# Patient Record
Sex: Male | Born: 1960 | Race: White | Hispanic: No | Marital: Married | Smoking: Former smoker
Health system: Southern US, Community
[De-identification: ages and names within clinical notes are randomized; demographics above are authoritative.]

## PROBLEM LIST (undated history)

## (undated) DIAGNOSIS — I2510 Atherosclerotic heart disease of native coronary artery without angina pectoris: Secondary | ICD-10-CM

## (undated) DIAGNOSIS — Z86718 Personal history of other venous thrombosis and embolism: Secondary | ICD-10-CM

## (undated) DIAGNOSIS — F419 Anxiety disorder, unspecified: Secondary | ICD-10-CM

## (undated) DIAGNOSIS — Z923 Personal history of irradiation: Secondary | ICD-10-CM

## (undated) DIAGNOSIS — I639 Cerebral infarction, unspecified: Secondary | ICD-10-CM

## (undated) DIAGNOSIS — I219 Acute myocardial infarction, unspecified: Secondary | ICD-10-CM

## (undated) DIAGNOSIS — E785 Hyperlipidemia, unspecified: Secondary | ICD-10-CM

## (undated) DIAGNOSIS — Z86711 Personal history of pulmonary embolism: Secondary | ICD-10-CM

## (undated) DIAGNOSIS — Z87828 Personal history of other (healed) physical injury and trauma: Secondary | ICD-10-CM

## (undated) DIAGNOSIS — G4730 Sleep apnea, unspecified: Secondary | ICD-10-CM

## (undated) DIAGNOSIS — E119 Type 2 diabetes mellitus without complications: Secondary | ICD-10-CM

## (undated) DIAGNOSIS — Z8572 Personal history of non-Hodgkin lymphomas: Secondary | ICD-10-CM

## (undated) DIAGNOSIS — I10 Essential (primary) hypertension: Secondary | ICD-10-CM

## (undated) DIAGNOSIS — Z8739 Personal history of other diseases of the musculoskeletal system and connective tissue: Secondary | ICD-10-CM

## (undated) DIAGNOSIS — Z9221 Personal history of antineoplastic chemotherapy: Secondary | ICD-10-CM

## (undated) DIAGNOSIS — I429 Cardiomyopathy, unspecified: Secondary | ICD-10-CM

## (undated) HISTORY — DX: Type 2 diabetes mellitus without complications: E11.9

## (undated) HISTORY — DX: Atherosclerotic heart disease of native coronary artery without angina pectoris: I25.10

## (undated) HISTORY — DX: Hyperlipidemia, unspecified: E78.5

## (undated) HISTORY — DX: Anxiety disorder, unspecified: F41.9

## (undated) HISTORY — DX: Essential (primary) hypertension: I10

## (undated) HISTORY — DX: Cerebral infarction, unspecified: I63.9

---

## 1987-10-07 DIAGNOSIS — Z87828 Personal history of other (healed) physical injury and trauma: Secondary | ICD-10-CM

## 1987-10-07 HISTORY — PX: EXPLORATORY LAPAROTOMY: SUR591

## 1987-10-07 HISTORY — DX: Personal history of other (healed) physical injury and trauma: Z87.828

## 1998-01-10 ENCOUNTER — Inpatient Hospital Stay (HOSPITAL_COMMUNITY): Admission: EM | Admit: 1998-01-10 | Discharge: 1998-01-11 | Payer: Self-pay | Admitting: Emergency Medicine

## 2001-09-24 ENCOUNTER — Ambulatory Visit (HOSPITAL_COMMUNITY): Admission: RE | Admit: 2001-09-24 | Discharge: 2001-09-24 | Payer: Self-pay | Admitting: Cardiology

## 2001-10-06 HISTORY — PX: CORONARY ARTERY BYPASS GRAFT: SHX141

## 2001-10-20 ENCOUNTER — Encounter: Payer: Self-pay | Admitting: Surgery

## 2001-10-22 ENCOUNTER — Encounter: Payer: Self-pay | Admitting: Surgery

## 2001-10-22 ENCOUNTER — Inpatient Hospital Stay (HOSPITAL_COMMUNITY): Admission: RE | Admit: 2001-10-22 | Discharge: 2001-10-27 | Payer: Self-pay | Admitting: Surgery

## 2001-10-23 ENCOUNTER — Encounter: Payer: Self-pay | Admitting: Surgery

## 2001-10-24 ENCOUNTER — Encounter: Payer: Self-pay | Admitting: Cardiothoracic Surgery

## 2001-10-25 ENCOUNTER — Encounter: Payer: Self-pay | Admitting: Cardiothoracic Surgery

## 2004-10-06 HISTORY — PX: ABDOMINAL AORTIC ANEURYSM REPAIR: SUR1152

## 2004-10-06 HISTORY — PX: LAPAROSCOPIC CHOLECYSTECTOMY: SUR755

## 2005-07-31 ENCOUNTER — Ambulatory Visit: Payer: Self-pay | Admitting: Cardiovascular Disease

## 2005-08-01 ENCOUNTER — Ambulatory Visit: Payer: Self-pay | Admitting: Internal Medicine

## 2005-08-01 ENCOUNTER — Inpatient Hospital Stay (HOSPITAL_COMMUNITY): Admission: EM | Admit: 2005-08-01 | Discharge: 2005-08-02 | Payer: Self-pay | Admitting: Emergency Medicine

## 2005-09-15 ENCOUNTER — Inpatient Hospital Stay (HOSPITAL_COMMUNITY): Admission: RE | Admit: 2005-09-15 | Discharge: 2005-09-19 | Payer: Self-pay | Admitting: Vascular Surgery

## 2009-11-06 DIAGNOSIS — I639 Cerebral infarction, unspecified: Secondary | ICD-10-CM

## 2009-11-06 HISTORY — DX: Cerebral infarction, unspecified: I63.9

## 2010-11-11 ENCOUNTER — Emergency Department (HOSPITAL_COMMUNITY)
Admission: EM | Admit: 2010-11-11 | Discharge: 2010-11-11 | Disposition: A | Discharge status: Home / Self Care | Payer: BC Managed Care – PPO | Attending: Emergency Medicine | Admitting: Emergency Medicine

## 2010-11-11 DIAGNOSIS — R630 Anorexia: Secondary | ICD-10-CM | POA: Insufficient documentation

## 2010-11-11 DIAGNOSIS — R5381 Other malaise: Secondary | ICD-10-CM | POA: Insufficient documentation

## 2010-11-11 DIAGNOSIS — E7800 Pure hypercholesterolemia, unspecified: Secondary | ICD-10-CM | POA: Insufficient documentation

## 2010-11-11 DIAGNOSIS — R42 Dizziness and giddiness: Secondary | ICD-10-CM | POA: Insufficient documentation

## 2010-11-11 DIAGNOSIS — R110 Nausea: Secondary | ICD-10-CM | POA: Insufficient documentation

## 2010-11-11 DIAGNOSIS — R5383 Other fatigue: Secondary | ICD-10-CM | POA: Insufficient documentation

## 2010-11-11 DIAGNOSIS — I10 Essential (primary) hypertension: Secondary | ICD-10-CM | POA: Insufficient documentation

## 2010-11-11 LAB — HEPATIC FUNCTION PANEL
ALT: 24 U/L (ref 0–53)
AST: 26 U/L (ref 0–37)
Albumin: 3.8 g/dL (ref 3.5–5.2)
Alkaline Phosphatase: 60 U/L (ref 39–117)
Total Bilirubin: 1.2 mg/dL (ref 0.3–1.2)
Total Protein: 6.4 g/dL (ref 6.0–8.3)

## 2010-11-11 LAB — POCT I-STAT 3, ART BLOOD GAS (G3+)
Bicarbonate: 25.2 mEq/L — ABNORMAL HIGH (ref 20.0–24.0)
O2 Saturation: 95 %
Patient temperature: 97.7
TCO2: 26 mmol/L (ref 0–100)
pCO2 arterial: 34.9 mmHg — ABNORMAL LOW (ref 35.0–45.0)
pO2, Arterial: 68 mmHg — ABNORMAL LOW (ref 80.0–100.0)

## 2010-11-11 LAB — CARBOXYHEMOGLOBIN
Carboxyhemoglobin: 1.8 % — ABNORMAL HIGH (ref 0.5–1.5)
Methemoglobin: 0.9 % (ref 0.0–1.5)
O2 Saturation: 96.7 %

## 2010-12-17 DIAGNOSIS — I6789 Other cerebrovascular disease: Secondary | ICD-10-CM

## 2011-12-08 ENCOUNTER — Encounter (INDEPENDENT_AMBULATORY_CARE_PROVIDER_SITE_OTHER): Payer: Self-pay | Admitting: Surgery

## 2011-12-09 ENCOUNTER — Ambulatory Visit (INDEPENDENT_AMBULATORY_CARE_PROVIDER_SITE_OTHER): Payer: BC Managed Care – PPO | Admitting: Surgery

## 2011-12-09 ENCOUNTER — Encounter (INDEPENDENT_AMBULATORY_CARE_PROVIDER_SITE_OTHER): Payer: Self-pay | Admitting: Surgery

## 2011-12-09 VITALS — BP 162/108 | HR 84 | Temp 97.8°F | Resp 20 | Ht 67.0 in | Wt 262.0 lb

## 2011-12-09 DIAGNOSIS — R229 Localized swelling, mass and lump, unspecified: Secondary | ICD-10-CM

## 2011-12-09 DIAGNOSIS — R2230 Localized swelling, mass and lump, unspecified upper limb: Secondary | ICD-10-CM | POA: Insufficient documentation
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
## 2011-12-10 ENCOUNTER — Encounter (HOSPITAL_COMMUNITY): Payer: Self-pay | Admitting: Pharmacy Technician

## 2011-12-11 NOTE — Pre-Procedure Instructions (Signed)
20 STONE SPIRITO  12/11/2011   Your procedure is scheduled on:  Tues, Mar 12 @ 1200 PM  Report to [facility] at 1000 AM.  Call this number if you have problems the morning of surgery: [phone]   Remember:   Do not eat food:After Midnight.  May have clear liquids: up to 4 Hours before arrival.(until 6:00 am)  Clear liquids include soda, tea, black coffee, apple or grape juice, broth.  Take these medicines the morning of surgery with A SIP OF WATER: Xanax and Amlodipine   Do not wear jewelry, make-up or nail polish.  Do not wear lotions, powders, or perfumes. You may wear deodorant.  Do not shave 48 hours prior to surgery.  Do not bring valuables to the hospital.  Contacts, dentures or bridgework may not be worn into surgery.  Leave suitcase in the car. After surgery it may be brought to your room.  For patients admitted to the hospital, checkout time is 11:00 AM the day of discharge.   Patients discharged the day of surgery will not be allowed to drive home.    Special Instructions: CHG Shower Use Special Wash: 1/2 bottle night before surgery and 1/2 bottle morning of surgery.   Please read over the following fact sheets that you were given: Pain Booklet, Coughing and Deep Breathing, MRSA Information and Surgical Site Infection Prevention

## 2011-12-12 ENCOUNTER — Encounter (HOSPITAL_COMMUNITY)
Admission: RE | Admit: 2011-12-12 | Discharge: 2011-12-12 | Disposition: A | Discharge status: Home / Self Care | Payer: BC Managed Care – PPO | Source: Ambulatory Visit | Attending: Surgery | Admitting: Surgery

## 2011-12-12 ENCOUNTER — Other Ambulatory Visit: Payer: Self-pay

## 2011-12-12 ENCOUNTER — Encounter (HOSPITAL_COMMUNITY)
Admission: RE | Admit: 2011-12-12 | Discharge: 2011-12-12 | Disposition: A | Discharge status: Home / Self Care | Payer: BC Managed Care – PPO | Source: Ambulatory Visit | Attending: Anesthesiology | Admitting: Anesthesiology

## 2011-12-12 ENCOUNTER — Encounter (HOSPITAL_COMMUNITY): Payer: Self-pay

## 2011-12-12 DIAGNOSIS — Z09 Encounter for follow-up examination after completed treatment for conditions other than malignant neoplasm: Secondary | ICD-10-CM | POA: Insufficient documentation

## 2011-12-12 LAB — BASIC METABOLIC PANEL
GFR calc non Af Amer: >90 mL/min (ref >90)
Glucose, Bld: 206 mg/dL — ABNORMAL HIGH (ref 70–99)
Potassium: 3.5 mEq/L (ref 3.5–5.1)
Sodium: 141 mEq/L (ref 135–145)

## 2011-12-12 LAB — SURGICAL PCR SCREEN
MRSA, PCR: NEGATIVE
Staphylococcus aureus: POSITIVE — AB

## 2011-12-12 LAB — CBC
Hemoglobin: 16.2 g/dL (ref 13.0–17.0)
MCH: 30.4 pg (ref 26.0–34.0)
RBC: 5.33 MIL/uL (ref 4.22–5.81)
WBC: 8.8 10*3/uL (ref 4.0–10.5)

## 2011-12-12 NOTE — Progress Notes (Addendum)
Pt doesn't have a cardiologist;last saw Dr.Katz several years ago  Stress test done > 17yrs ago Echo done at [facility] in 2012-to request records  Heart cath done around 06/07  Several EKG's in [facility]

## 2011-12-12 NOTE — Progress Notes (Addendum)
Sees a neurologist Dr.Myers in [place] with [facility] is primary care-to request last office visit

## 2011-12-15 ENCOUNTER — Encounter (HOSPITAL_COMMUNITY): Payer: Self-pay | Admitting: Vascular Surgery

## 2011-12-15 ENCOUNTER — Encounter (INDEPENDENT_AMBULATORY_CARE_PROVIDER_SITE_OTHER): Payer: Self-pay | Admitting: General Surgery

## 2011-12-15 MED ORDER — CEFAZOLIN SODIUM-DEXTROSE 2-3 GM-% IV SOLR: 2.0000 g | INTRAVENOUS | Status: DC

## 2011-12-15 NOTE — Consult Note (Signed)
Anesthesia:  Patient is a 51 year old male scheduled for an excision of a right axillary mass (5.7 X 5.4 X 3.5 cm) by Dr. Corliss Skains on 12/16/11.  By Dr. Fatima Sanger H&P, the mass most likely represents a possible enlarged LN with central necrosis by U/S or a lipoma.  History includes smoking, CAD/CABG '03, MI '99, HLD, CVA 11/2010, HTN, anxiety, obesity (BMI 41), history of blood transfusion, s/p aorto-iliac bypass for AAA in 09/2005.  His non-fasting glucose is 206 although he currently does not report a history of DM.  Meds include Xanax, amlodipine, ASA, Plavix, HCTZ, and losartan.  CXR shows no active disease.  EKG shows NSR, possible LAE, septal infarct age undetermined, lateral T wave abnormality (appears new in I, aVL since 11/12/10 but not as apparent in V4, V5).  His pre-op  EKG on 09/16/05 also showed T wave inversion in I and aVL.  I called an spoke with William Calderon.  His PCP is Prudy Feeler, PA-C at [facility] in [place].  He was encouraged to follow-up with her regarding his hyperglycemia.  He confirms that he has not seen a Cardiologist is several years and has not had a stress test in > 5 years.  He had either a TIA or small CVA with no residual in March 2012 and had an echo done then, but did not see a Cardiologist.   Echo on 12/17/10 showed EF 69%, LV chamber size normal, LV wall motion and contractility are WNL, no significant valvular abnormalities. (In the echo conclusion it states, "There is a definite focal wall motion abnormalities", but I do not see this mentioned in the body of the report.  Unsure if this is actual or a transcription error.)  He denies CP, SOB, edema, polydipsia and polyuria.  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  He is able to walk up a flight of stairs, do yard work without difficulty.  I reviewed above with Anesthesiologist Dr. Ivin Booty.  Anesthesia recommends Cardiology evaluate pre-operatively due to his strong CAD history,  no recent Cardiology follow-up, questionable EKG changes, and unclear echo results.  I have notified Triage nurse Britta Mccreedy at [facility] that patient needs Cardiac clearance prior to proceeding.  I verified that their office will make Dr. Corliss Skains,  OR scheduling, and the patient aware as well.

## 2011-12-16 ENCOUNTER — Encounter (HOSPITAL_COMMUNITY): Admission: RE | Payer: Self-pay | Source: Ambulatory Visit

## 2011-12-16 ENCOUNTER — Ambulatory Visit (HOSPITAL_COMMUNITY): Admission: RE | Admit: 2011-12-16 | Payer: BC Managed Care – PPO | Source: Ambulatory Visit | Admitting: Surgery

## 2011-12-16 SURGERY — EXCISION MASS
Anesthesia: General | Site: Axilla | Laterality: Right

## 2012-01-14 ENCOUNTER — Ambulatory Visit (INDEPENDENT_AMBULATORY_CARE_PROVIDER_SITE_OTHER): Payer: BC Managed Care – PPO | Admitting: Cardiology

## 2012-01-14 ENCOUNTER — Encounter: Payer: Self-pay | Admitting: Cardiology

## 2012-01-14 VITALS — BP 152/80 | HR 79 | Resp 18 | Ht 67.0 in | Wt 263.0 lb

## 2012-01-14 DIAGNOSIS — E785 Hyperlipidemia, unspecified: Secondary | ICD-10-CM

## 2012-01-14 DIAGNOSIS — F17200 Nicotine dependence, unspecified, uncomplicated: Secondary | ICD-10-CM

## 2012-01-14 DIAGNOSIS — Z720 Tobacco use: Secondary | ICD-10-CM | POA: Insufficient documentation

## 2012-01-14 DIAGNOSIS — Z01810 Encounter for preprocedural cardiovascular examination: Secondary | ICD-10-CM

## 2012-01-14 DIAGNOSIS — I10 Essential (primary) hypertension: Secondary | ICD-10-CM

## 2012-01-14 DIAGNOSIS — I2510 Atherosclerotic heart disease of native coronary artery without angina pectoris: Secondary | ICD-10-CM | POA: Insufficient documentation
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
## 2012-01-14 NOTE — Assessment & Plan Note (Signed)
Patient is scheduled for general anesthesia. His coronary artery bypass and graft was 10 years ago. Plan Myoview for risk stratification preoperatively. If normal or low risk he may proceed with surgery.

## 2012-01-14 NOTE — Assessment & Plan Note (Signed)
Continue aspirin. He is off of Plavix for 2 months by his report. He apparently has been intolerant to statins in the past. He will consider trying Pravachol and contact us.

## 2012-01-14 NOTE — Assessment & Plan Note (Signed)
Patient counseled on discontinuing. 

## 2012-01-14 NOTE — Patient Instructions (Addendum)
Your physician wants you to follow-up in: 12 months with Dr Jens Som in [facility] office You will receive a reminder letter in the mail two months in advance. If you don't receive a letter, please call our office to schedule the follow-up appointment.   Your physician has requested that you have a lexiscan myoview. For further information please visit [email or web address] Please follow instruction sheet, as given.

## 2012-01-14 NOTE — Assessment & Plan Note (Signed)
Patient apparently had myalgias with Lipitor. He also thinks other statins may have caused problems. We discussed attempting Pravachol. He will consider this and contact us if agreeable. We discussed the benefits of statins in patients with coronary disease.

## 2012-01-14 NOTE — Assessment & Plan Note (Signed)
Blood pressure mildly elevated. He will continue his present medications and we will increase as needed.

## 2012-01-15 ENCOUNTER — Ambulatory Visit (HOSPITAL_COMMUNITY): Payer: BC Managed Care – PPO | Attending: Cardiology | Admitting: Radiology

## 2012-01-15 VITALS — BP 153/83 | Ht 67.0 in | Wt 260.0 lb

## 2012-01-15 DIAGNOSIS — Z01810 Encounter for preprocedural cardiovascular examination: Secondary | ICD-10-CM | POA: Insufficient documentation

## 2012-01-15 DIAGNOSIS — E669 Obesity, unspecified: Secondary | ICD-10-CM | POA: Insufficient documentation

## 2012-01-15 DIAGNOSIS — F17200 Nicotine dependence, unspecified, uncomplicated: Secondary | ICD-10-CM | POA: Insufficient documentation

## 2012-01-15 DIAGNOSIS — I10 Essential (primary) hypertension: Secondary | ICD-10-CM

## 2012-01-15 DIAGNOSIS — Z8679 Personal history of other diseases of the circulatory system: Secondary | ICD-10-CM | POA: Insufficient documentation

## 2012-01-15 DIAGNOSIS — Z951 Presence of aortocoronary bypass graft: Secondary | ICD-10-CM | POA: Insufficient documentation

## 2012-01-15 DIAGNOSIS — E785 Hyperlipidemia, unspecified: Secondary | ICD-10-CM | POA: Insufficient documentation

## 2012-01-15 DIAGNOSIS — Z720 Tobacco use: Secondary | ICD-10-CM

## 2012-01-15 DIAGNOSIS — I2510 Atherosclerotic heart disease of native coronary artery without angina pectoris: Secondary | ICD-10-CM

## 2012-01-15 MED ORDER — TECHNETIUM TC 99M TETROFOSMIN IV KIT
33.0000 | PACK | Freq: Once | INTRAVENOUS | Status: AC | PRN
  Administered 2012-01-15: 33 via INTRAVENOUS

## 2012-01-15 MED ORDER — TECHNETIUM TC 99M TETROFOSMIN IV KIT
11.0000 | PACK | Freq: Once | INTRAVENOUS | Status: AC | PRN
  Administered 2012-01-15: 11 via INTRAVENOUS

## 2012-01-15 MED ORDER — REGADENOSON 0.4 MG/5ML IV SOLN
0.4000 mg | Freq: Once | INTRAVENOUS | Status: AC
  Administered 2012-01-15: 0.4 mg via INTRAVENOUS

## 2012-01-15 NOTE — Progress Notes (Signed)
[facility] SITE 3 NUCLEAR MED [address] [place] [address] [phone]  Cardiology Nuclear Med Study  William Calderon is a 51 y.o. male     MRN : [id number]     DOB: 03/26/1961  Procedure Date: 01/15/2012  Nuclear Med Background Indication for Stress Test:  Evaluation for Ischemia and Clearance for Pending Surgical Removal of Axillary Mass by Dr. Manus Rudd History:  3/99 AWMI; 5/99 ION:GEXBMW; 12/02 UXL:KGMWNU ischemia, EF=35%>1/03 CABG; ' 06 Echo:Normal; '06 AAA Repair Cardiac Risk Factors: CVA, Hypertension, Lipids, Obesity and Smoker  Symptoms:  No cardiac complaints   Nuclear Pre-Procedure Caffeine/Decaff Intake:  None NPO After: 630 pm   Lungs:  clear O2 Sat: 95% on room air. IV 0.9% NS with Angio Cath:  20g  IV Site: R Forearm  IV Started by:  Bonnita Levan, RN  Chest Size (in):  50 Cup Size: n/a  Height: 5\' 7"  (1.702 m)  Weight:  260 lb (117.935 kg)  BMI:  Body mass index is 40.72 kg/(m^2). Tech Comments:  N/A    Nuclear Med Study 1 or 2 day study: 1 day  Stress Test Type:  Lexiscan  Reading MD: Marca Ancona, MD  Order Authorizing Provider:  Olga Millers, MD  Resting Radionuclide: Technetium 41m Tetrofosmin  Resting Radionuclide Dose: 11.0 mCi   Stress Radionuclide:  Technetium 31m Tetrofosmin  Stress Radionuclide Dose: 32.0 mCi           Stress Protocol Rest HR: 65 Stress HR: 100  Rest BP: 153/83 Stress BP: 140/86  Exercise Time (min): 2:00 METS: n/a   Predicted Max HR: 169 bpm % Max HR: 59.17 bpm Rate Pressure Product: 27253   Dose of Adenosine (mg):  n/a Dose of Lexiscan: 0.4 mg  Dose of Atropine (mg): n/a Dose of Dobutamine: n/a mcg/kg/min (at max HR)  Stress Test Technologist: Smiley Houseman, CMA-N  Nuclear Technologist:  Domenic Polite, CNMT     Rest Procedure:  Myocardial perfusion imaging was performed at rest 45 minutes following the intravenous administration of Technetium 59m Tetrofosmin.  Rest ECG: 3-beat  v-tach, prior SWMI and nonspecific T-wave changes.  Stress Procedure:  The patient received IV Lexiscan 0.4 mg over 15-seconds with concurrent low level exercise and then Technetium 62m Tetrofosmin was injected at 30-seconds while the patient continued walking one more minute. There were no significant changes with Lexiscan; frequent PVC's with occasional couplets noted. Quantitative spect images were obtained after a 45-minute delay.  Stress ECG: PVCs  QPS Raw Data Images:  Normal; no motion artifact; normal heart/lung ratio. Stress Images:  Large, severe mid to apical anterior and anteroseptal perfusion defect.  Rest Images:  Large, severe mid to apical anterior and anteroseptal perfusion defect.  Subtraction (SDS):  Fixed large mid to apical anterior and anteroseptal perfusion defect.  Transient Ischemic Dilatation (Normal <1.22):  1.10 Lung/Heart Ratio (Normal <0.45):  0.43  Quantitative Gated Spect Images QGS EDV:  207 ml QGS ESV:  122 ml  Impression Exercise Capacity:  Lexiscan with low level exercise. BP Response:  Normal blood pressure response. Clinical Symptoms:  Short of breath.  ECG Impression:  PVCs Comparison with Prior Nuclear Study: Inferolateral defect on prior study is no longer present.   Overall Impression:  Abnormal stress nuclear study.  Large, severe fixed mid to apical anterior and anteroseptal perfusion defect suggesting prior MI with minimal ischemia.   LV Ejection Fraction: 41%.  LV Wall Motion:  Peri-apical hypokinesis.   Mylez Venable [employer]

## 2012-01-16 ENCOUNTER — Telehealth (INDEPENDENT_AMBULATORY_CARE_PROVIDER_SITE_OTHER): Payer: Self-pay | Admitting: General Surgery

## 2012-01-16 ENCOUNTER — Other Ambulatory Visit (INDEPENDENT_AMBULATORY_CARE_PROVIDER_SITE_OTHER): Payer: Self-pay | Admitting: Surgery

## 2012-01-16 ENCOUNTER — Telehealth: Payer: Self-pay | Admitting: Cardiology

## 2012-01-16 ENCOUNTER — Telehealth (INDEPENDENT_AMBULATORY_CARE_PROVIDER_SITE_OTHER): Payer: Self-pay

## 2012-01-16 DIAGNOSIS — I10 Essential (primary) hypertension: Secondary | ICD-10-CM

## 2012-01-16 MED ORDER — METOPROLOL SUCCINATE ER 25 MG PO TB24: 25.0000 mg | ORAL_TABLET | Freq: Every day | ORAL | Status: DC

## 2012-01-16 NOTE — Telephone Encounter (Signed)
Stress test done yesterday, requesting results

## 2012-01-16 NOTE — Telephone Encounter (Signed)
Notified mr Alejos with results of stress test, called in metoprolol succ. 25mg  1 tab qd--#30 with11 refills--[facility]---called [facility] and faxed stress test and OK for surgery to them--pt aware of all the above information--nt

## 2012-01-16 NOTE — Telephone Encounter (Signed)
Called William Calderon and told him that we had his Cardiac Clearance and that our schedulers will give him a call

## 2012-01-16 NOTE — Telephone Encounter (Signed)
done

## 2012-01-21 ENCOUNTER — Encounter (HOSPITAL_COMMUNITY): Payer: Self-pay | Admitting: Pharmacy Technician

## 2012-01-22 ENCOUNTER — Encounter (HOSPITAL_COMMUNITY): Payer: Self-pay

## 2012-01-22 ENCOUNTER — Encounter (HOSPITAL_COMMUNITY)
Admission: RE | Admit: 2012-01-22 | Discharge: 2012-01-22 | Disposition: A | Discharge status: Home / Self Care | Payer: BC Managed Care – PPO | Source: Ambulatory Visit | Attending: Surgery | Admitting: Surgery

## 2012-01-22 HISTORY — DX: Sleep apnea, unspecified: G47.30

## 2012-01-22 LAB — BASIC METABOLIC PANEL
BUN: 10 mg/dL (ref 6–23)
CO2: 25 mEq/L (ref 19–32)
Calcium: 9.5 mg/dL (ref 8.4–10.5)
Creatinine, Ser: 0.73 mg/dL (ref 0.50–1.35)
Glucose, Bld: 145 mg/dL — ABNORMAL HIGH (ref 70–99)

## 2012-01-22 LAB — CBC
HCT: 44.9 % (ref 39.0–52.0)
Hemoglobin: 15.9 g/dL (ref 13.0–17.0)
MCH: 32 pg (ref 26.0–34.0)
MCV: 90.3 fL (ref 78.0–100.0)
RBC: 4.97 MIL/uL (ref 4.22–5.81)

## 2012-01-22 LAB — SURGICAL PCR SCREEN: Staphylococcus aureus: NEGATIVE

## 2012-01-22 NOTE — Patient Instructions (Addendum)
[name]  01/22/2012   Your procedure is scheduled on:  Tuesday 01-27-2012  Report to [facility] a [phone]   AM.  Call this number if you have problems the morning of surgery: [phone]   Remember:   Do not eat food or drink liquids:After Midnight.  .  Take these medicines the morning of surgery with A SIP OF WATER: XANAX    Do not wear jewelry or make up.  Do not wear lotions, powders, or perfumes.Do not wear deodorant.    Do not bring valuables to the hospital.  Contacts, dentures or bridgework may not be worn into surgery.  Leave suitcase in the car. After surgery it may be brought to your room.  For patients admitted to the hospital, checkout time is 11:00 AM the day of discharge.     Special Instructions: CHG Shower Use Special Wash: 1/2 bottle night before surgery and 1/2 bottle morning of surgery.neck down avoid private area   Please read over the following fact sheets that you were given: MRSA Information  William Calderon [facility] pre op nurse phone number [phone], call if needed

## 2012-01-22 NOTE — Progress Notes (Signed)
01/22/12 1458  OBSTRUCTIVE SLEEP APNEA  Have you ever been diagnosed with sleep apnea through a sleep study? No  Do you snore loudly (loud enough to be heard through closed doors)?  0  Do you often feel tired, fatigued, or sleepy during the daytime? 0  Has anyone observed you stop breathing during your sleep? 0  Do you have, or are you being treated for high blood pressure? 1  BMI more than 35 kg/m2? 1  Age over 52 years old? 1  Gender: 1  Obstructive Sleep Apnea Score 4   Score 4 or greater  Updated health history;Results sent to PCP

## 2012-01-22 NOTE — Pre-Procedure Instructions (Addendum)
STRESS TEST 01-16-12 IN EPIC AND ON CHART CHEST 2 VIEW XRAY 12-12-11 IN EPIC AND ON CHART 01-14-12 EKG IN EPIC AND ON CHART 01-14-12 LAST CARDIOLOGY OFFICE VISIT NOTE DR Jens Som IN EPIC AND ON CHART

## 2012-01-27 ENCOUNTER — Ambulatory Visit (HOSPITAL_COMMUNITY): Payer: BC Managed Care – PPO | Admitting: Anesthesiology

## 2012-01-27 ENCOUNTER — Ambulatory Visit (HOSPITAL_COMMUNITY)
Admission: RE | Admit: 2012-01-27 | Discharge: 2012-01-27 | Disposition: A | Discharge status: Home / Self Care | Payer: BC Managed Care – PPO | Source: Ambulatory Visit | Attending: Surgery | Admitting: Surgery

## 2012-01-27 ENCOUNTER — Encounter (HOSPITAL_COMMUNITY)
Admission: RE | Disposition: A | Discharge status: Home / Self Care | Payer: Self-pay | Source: Ambulatory Visit | Attending: Surgery

## 2012-01-27 ENCOUNTER — Encounter (HOSPITAL_COMMUNITY): Payer: Self-pay | Admitting: *Deleted

## 2012-01-27 ENCOUNTER — Encounter (HOSPITAL_COMMUNITY): Payer: Self-pay | Admitting: Anesthesiology

## 2012-01-27 DIAGNOSIS — Z79899 Other long term (current) drug therapy: Secondary | ICD-10-CM | POA: Insufficient documentation

## 2012-01-27 DIAGNOSIS — I252 Old myocardial infarction: Secondary | ICD-10-CM | POA: Insufficient documentation

## 2012-01-27 DIAGNOSIS — C8584 Other specified types of non-Hodgkin lymphoma, lymph nodes of axilla and upper limb: Secondary | ICD-10-CM

## 2012-01-27 DIAGNOSIS — I2510 Atherosclerotic heart disease of native coronary artery without angina pectoris: Secondary | ICD-10-CM | POA: Insufficient documentation

## 2012-01-27 DIAGNOSIS — I10 Essential (primary) hypertension: Secondary | ICD-10-CM | POA: Insufficient documentation

## 2012-01-27 DIAGNOSIS — Z01812 Encounter for preprocedural laboratory examination: Secondary | ICD-10-CM | POA: Insufficient documentation

## 2012-01-27 DIAGNOSIS — R2230 Localized swelling, mass and lump, unspecified upper limb: Secondary | ICD-10-CM

## 2012-01-27 DIAGNOSIS — C8294 Follicular lymphoma, unspecified, lymph nodes of axilla and upper limb: Secondary | ICD-10-CM | POA: Insufficient documentation

## 2012-01-27 DIAGNOSIS — Z951 Presence of aortocoronary bypass graft: Secondary | ICD-10-CM | POA: Insufficient documentation

## 2012-01-27 DIAGNOSIS — F17200 Nicotine dependence, unspecified, uncomplicated: Secondary | ICD-10-CM | POA: Insufficient documentation

## 2012-01-27 DIAGNOSIS — Z8673 Personal history of transient ischemic attack (TIA), and cerebral infarction without residual deficits: Secondary | ICD-10-CM | POA: Insufficient documentation

## 2012-01-27 DIAGNOSIS — E785 Hyperlipidemia, unspecified: Secondary | ICD-10-CM | POA: Insufficient documentation

## 2012-01-27 DIAGNOSIS — Z7982 Long term (current) use of aspirin: Secondary | ICD-10-CM | POA: Insufficient documentation

## 2012-01-27 HISTORY — PX: MASS EXCISION: SHX2000

## 2012-01-27 SURGERY — EXCISION MASS
Anesthesia: General | Site: Axilla | Laterality: Right | Wound class: Clean

## 2012-01-27 MED ORDER — OXYCODONE-ACETAMINOPHEN 5-325 MG PO TABS: 1.0000 | ORAL_TABLET | ORAL | Status: AC | PRN

## 2012-01-27 MED ORDER — HYDROMORPHONE HCL PF 1 MG/ML IJ SOLN: 0.2500 mg | INTRAMUSCULAR | Status: DC | PRN

## 2012-01-27 MED ORDER — ONDANSETRON HCL 4 MG/2ML IJ SOLN: 4.0000 mg | INTRAMUSCULAR | Status: DC | PRN

## 2012-01-27 MED ORDER — LACTATED RINGERS IV SOLN
INTRAVENOUS | Status: DC | PRN
  Administered 2012-01-27 (×2): via INTRAVENOUS

## 2012-01-27 MED ORDER — OXYCODONE-ACETAMINOPHEN 5-325 MG PO TABS: 1.0000 | ORAL_TABLET | ORAL | Status: DC | PRN

## 2012-01-27 MED ORDER — FENTANYL CITRATE 0.05 MG/ML IJ SOLN
INTRAMUSCULAR | Status: DC | PRN
  Administered 2012-01-27: 50 ug via INTRAVENOUS
  Administered 2012-01-27: 100 ug via INTRAVENOUS

## 2012-01-27 MED ORDER — MORPHINE SULFATE 10 MG/ML IJ SOLN: 2.0000 mg | INTRAMUSCULAR | Status: DC | PRN

## 2012-01-27 MED ORDER — EPHEDRINE SULFATE 50 MG/ML IJ SOLN
INTRAMUSCULAR | Status: DC | PRN
  Administered 2012-01-27: 7.5 mg via INTRAVENOUS

## 2012-01-27 MED ORDER — LACTATED RINGERS IV SOLN: INTRAVENOUS | Status: DC

## 2012-01-27 MED ORDER — MIDAZOLAM HCL 5 MG/5ML IJ SOLN
INTRAMUSCULAR | Status: DC | PRN
  Administered 2012-01-27: 2 mg via INTRAVENOUS

## 2012-01-27 MED ORDER — PROPOFOL 10 MG/ML IV EMUL
INTRAVENOUS | Status: DC | PRN
  Administered 2012-01-27: 200 mg via INTRAVENOUS

## 2012-01-27 MED ORDER — 0.9 % SODIUM CHLORIDE (POUR BTL) OPTIME
TOPICAL | Status: DC | PRN
  Administered 2012-01-27: 1000 mL

## 2012-01-27 MED ORDER — MEPERIDINE HCL 50 MG/ML IJ SOLN: 6.2500 mg | INTRAMUSCULAR | Status: DC | PRN

## 2012-01-27 MED ORDER — BUPIVACAINE-EPINEPHRINE PF 0.25-1:200000 % IJ SOLN
INTRAMUSCULAR | Status: DC | PRN
  Administered 2012-01-27: 9 mL

## 2012-01-27 MED ORDER — PROMETHAZINE HCL 25 MG/ML IJ SOLN: 6.2500 mg | INTRAMUSCULAR | Status: DC | PRN

## 2012-01-27 MED ORDER — BUPIVACAINE-EPINEPHRINE PF 0.25-1:200000 % IJ SOLN
INTRAMUSCULAR | Status: AC
  Filled 2012-01-27: qty 30

## 2012-01-27 SURGICAL SUPPLY — 47 items
ADH SKN CLS APL DERMABOND .7 (GAUZE/BANDAGES/DRESSINGS)
APL SKNCLS STERI-STRIP NONHPOA (GAUZE/BANDAGES/DRESSINGS) ×2
APPLIER CLIP 11 MED OPEN (CLIP) ×2
APR CLP MED 11 20 MLT OPN (CLIP) ×1
BENZOIN TINCTURE PRP APPL 2/3 (GAUZE/BANDAGES/DRESSINGS) ×4 IMPLANT
BLADE HEX COATED 2.75 (ELECTRODE) IMPLANT
BLADE SURG 15 STRL LF DISP TIS (BLADE) IMPLANT
BLADE SURG 15 STRL SS (BLADE)
CANISTER SUCTION 1200CC (MISCELLANEOUS) ×2 IMPLANT
CHLORAPREP W/TINT 26ML (MISCELLANEOUS) ×2 IMPLANT
CLIP APPLIE 11 MED OPEN (CLIP) ×1 IMPLANT
COVER SURGICAL LIGHT HANDLE (MISCELLANEOUS) ×1 IMPLANT
DECANTER SPIKE VIAL GLASS SM (MISCELLANEOUS) IMPLANT
DERMABOND ADVANCED (GAUZE/BANDAGES/DRESSINGS)
DERMABOND ADVANCED .7 DNX12 (GAUZE/BANDAGES/DRESSINGS) ×1 IMPLANT
DRAIN CHANNEL RND F F (WOUND CARE) ×1 IMPLANT
DRAPE UTILITY W/TAPE 26X15 (DRAPES) ×2 IMPLANT
DRAPE UTILITY XL STRL (DRAPES) ×2 IMPLANT
ELECT BLADE TIP CTD 4 INCH (ELECTRODE) ×1 IMPLANT
ELECT COATED BLADE 2.86 ST (ELECTRODE) IMPLANT
ELECT REM PT RETURN 9FT ADLT (ELECTROSURGICAL) ×2
ELECTRODE REM PT RTRN 9FT ADLT (ELECTROSURGICAL) ×1 IMPLANT
EVACUATOR DRAINAGE 10X20 100CC (DRAIN) IMPLANT
EVACUATOR SILICONE 100CC (DRAIN) ×2
GAUZE PACKING IODOFORM 1/4X5 (PACKING) IMPLANT
GLOVE BIO SURGEON STRL SZ7 (GLOVE) ×2 IMPLANT
GLOVE BIOGEL PI IND STRL 7.5 (GLOVE) ×1 IMPLANT
GLOVE BIOGEL PI INDICATOR 7.5 (GLOVE) ×1
GOWN STRL NON-REIN LRG LVL3 (GOWN DISPOSABLE) ×3 IMPLANT
GOWN STRL REIN XL XLG (GOWN DISPOSABLE) ×1 IMPLANT
NEEDLE HYPO 25X1 1.5 SAFETY (NEEDLE) ×2 IMPLANT
NS IRRIG 1000ML POUR BTL (IV SOLUTION) IMPLANT
PACK GENERAL/GYN (CUSTOM PROCEDURE TRAY) ×2 IMPLANT
SPONGE DRAIN TRACH 4X4 STRL 2S (GAUZE/BANDAGES/DRESSINGS) ×4 IMPLANT
SPONGE GAUZE 4X4 12PLY (GAUZE/BANDAGES/DRESSINGS) ×1 IMPLANT
STRIP CLOSURE SKIN 1/2X4 (GAUZE/BANDAGES/DRESSINGS) ×2 IMPLANT
SUT ETHILON 2 0 PS N (SUTURE) ×2 IMPLANT
SUT ETHILON 4 0 PS 2 18 (SUTURE) IMPLANT
SUT MNCRL AB 4-0 PS2 18 (SUTURE) ×2 IMPLANT
SUT SILK 2 0 SH (SUTURE) IMPLANT
SUT VIC AB 3-0 SH 27 (SUTURE) ×2
SUT VIC AB 3-0 SH 27XBRD (SUTURE) IMPLANT
SUT VIC AB 4-0 SH 18 (SUTURE) IMPLANT
SYR CONTROL 10ML LL (SYRINGE) ×2 IMPLANT
TAPE CLOTH SURG 4X10 WHT LF (GAUZE/BANDAGES/DRESSINGS) ×1 IMPLANT
TOWEL OR 17X26 10 PK STRL BLUE (TOWEL DISPOSABLE) ×2 IMPLANT
WATER STERILE IRR 1000ML POUR (IV SOLUTION) ×2 IMPLANT

## 2012-01-27 NOTE — Op Note (Signed)
Preop diagnosis: Right axillary mass (8 cm subcutaneous) Postop diagnosis: Same Procedure performed: Excision of right axillary mass Surgeon:  Nico Rogness K. Anesthesia: Gen.  Indications: this is a 51 year old male who presents with a three-month history of a palpable mass in his right axilla. This has become slightly larger and is causing some discomfort with movement. No history of trauma or infection in this area. An ultrasound showed hypoechoic oval circumscribed mass measuring 5.7 x 5.4 x 3.5 cm. The mass is notably larger at this time.  Description of procedure: The patient was brought to the operating room and placed in a supine position on the operating room table. After an adequate level of general anesthesia was obtained the patient's right chest and axilla were prepped with ChloraPrep and draped in sterile fashion. We infiltrated the skin and subcutaneous tissues over the mass with quarter percent Marcaine with epinephrine. I made a transverse incision. Dissection was carried down through the subcutaneous tissue to the surface of the mass. The mass is actually fairly firm. We excised the entire mass using blunt dissection as well as cautery dissection. We removed it in its entirety. There was a small bleeding vein which was ligated with hemoclips. The mass was sent for pathologic examination. We irrigated thoroughly and assured hemostasis. A 19 Blake drain was brought out through a stab incision and placed into the cavity. This was secured with a 2-0 nylon. 3-0 Vicryl was used to close subcutaneous tissues and 4 Monocryl was used to close the skin. Steri-Strips were applied. The drain was placed to bulb suction. All sponge, instrument, and needle counts are correct.  Wilmon Arms. Corliss Skains, MD, [facility]  01/27/2012 8:52 AM

## 2012-01-27 NOTE — Interval H&P Note (Signed)
History and Physical Interval Note:  01/27/2012 7:05 AM  William Calderon  has presented today for surgery, with the diagnosis of RIGHT AXILLARY MASS 5.7 CM  The various methods of treatment have been discussed with the patient and family. After consideration of risks, benefits and other options for treatment, the patient has consented to  Procedure(s) (LRB): EXCISION MASS (Right) as a surgical intervention .  The patients' history has been reviewed, patient examined, no change in status, stable for surgery.  I have reviewed the patients' chart and labs.  Questions were answered to the patient's satisfaction.     Lars Jeziorski K.

## 2012-01-27 NOTE — Anesthesia Preprocedure Evaluation (Addendum)
Anesthesia Evaluation  Patient identified by MRN, date of birth, ID band Patient awake    Reviewed: Allergy & Precautions, H&P , NPO status , Patient's Chart, lab work & pertinent test results  Airway Mallampati: III TM Distance: >3 FB Neck ROM: Full    Dental No notable dental hx.    Pulmonary neg pulmonary ROS, Current Smoker,  breath sounds clear to auscultation  Pulmonary exam normal       Cardiovascular hypertension, Pt. on medications + CAD (s/p cabg. neg stress test 4/13) and + Past MI negative cardio ROS  Rhythm:Regular Rate:Normal  S/p open AAA repair    Neuro/Psych CVA, No Residual Symptoms negative neurological ROS  negative psych ROS   GI/Hepatic negative GI ROS, Neg liver ROS, hiatal hernia,   Endo/Other  negative endocrine ROS  Renal/GU negative Renal ROS  negative genitourinary   Musculoskeletal negative musculoskeletal ROS (+)   Abdominal   Peds negative pediatric ROS (+)  Hematology negative hematology ROS (+)   Anesthesia Other Findings   Reproductive/Obstetrics negative OB ROS                          Anesthesia Physical Anesthesia Plan  ASA: III  Anesthesia Plan: General   Post-op Pain Management:    Induction: Intravenous  Airway Management Planned: LMA and Oral ETT  Additional Equipment:   Intra-op Plan:   Post-operative Plan: Extubation in OR  Informed Consent: I have reviewed the patients History and Physical, chart, labs and discussed the procedure including the risks, benefits and alternatives for the proposed anesthesia with the patient or authorized representative who has indicated his/her understanding and acceptance.   Dental advisory given  Plan Discussed with: CRNA  Anesthesia Plan Comments:        Anesthesia Quick Evaluation

## 2012-01-27 NOTE — Discharge Instructions (Signed)
[facility] A bulb drain is a small, plastic reservoir which creates a gentle suction. It is used to remove excess fluid from a surgical wound. The color and amount of fluid will vary. Immediately after surgery, the fluid is bright red. It may gradually change to a yellow color. When the amount decreases to about 1 or 2 tablespoons (15 to 30 cc) per 24 hours, your caregiver will usually remove it. DAILY CARE  Keep the bulb compressed at all times, except while emptying it. The compression creates suction.   Keep sites where the tubes enter the skin dry and covered with a light bandage (dressing).   Tape the tubes to your skin, 1 to 2 inches below the insertion sites, to keep from pulling on your stitches. Tubes are stitched in place and will not slip out.   Pin the bulb to your shirt (not to your pants) with a safety pin.   For the first few days after surgery, there usually is more fluid in the bulb. Empty the bulb whenever it becomes half full because the bulb does not create enough suction if it is too full. Include this amount in your 24 hour totals.   When the amount of drainage decreases, empty the bulb at the same time every day. Write down the amounts and the 24 hour totals. Your caregiver will want to know them. This helps your caregiver know when the tubes can be removed.   Once you are home, it is no longer necessary to strip the tubes as may have been done in the hospital. If there is drainage around the tube sites, change dressings and keep the area dry. If you see a clot in the tube, leave it alone. However, if the tube does not appear to be draining, let your caregiver know.  TO EMPTY THE BULB  Open the stopper to release suction.   Holding the stopper out of the way, pour drainage into the measuring cup that was sent home with you.   Measure and write down the amount. If there are 2 bulbs, note the amount of drainage from bulb 1 or bulb 2 and keep the totals separate.  Your caregiver will want to know which tube is draining more.   Compress the bulb by folding it in half.   Replace the stopper.   Check the tape that holds the tube to your skin, and pin the bulb to your shirt.  SEEK MEDICAL CARE IF:  The drainage develops a bad odor.   You have an oral temperature above 102 F (38.9 C).   The amount of drainage from your wound suddenly increases or decreases.   You accidentally pull out your drain.   You have any other questions or concerns.  MAKE SURE YOU:   Understand these instructions.   Will watch your condition.   Will get help right away if you are not doing well or get worse.  Document Released: 09/19/2000 Document Revised: 09/11/2011 Document Reviewed: 09/22/2005 Exit[employer] Patient Information 2012 [employer]. 

## 2012-01-27 NOTE — Anesthesia Postprocedure Evaluation (Signed)
  Anesthesia Post-op Note  Patient: William Calderon  Procedure(s) Performed: Procedure(s) (LRB): EXCISION MASS (Right)  Patient Location: PACU  Anesthesia Type: General  Level of Consciousness: awake and alert   Airway and Oxygen Therapy: Patient Spontanous Breathing  Post-op Pain: mild  Post-op Assessment: Post-op Vital signs reviewed, Patient's Cardiovascular Status Stable, Respiratory Function Stable, Patent Airway and No signs of Nausea or vomiting  Post-op Vital Signs: stable  Complications: No apparent anesthesia complications

## 2012-01-27 NOTE — Progress Notes (Signed)
Wife works at nursing home and feels very comfortable with [facility].
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
## 2012-01-27 NOTE — Progress Notes (Signed)
Dr. Willa Frater, anesthesiologist, aware of pt's SAO2 90-93 preop; same as preop

## 2012-01-27 NOTE — Transfer of Care (Signed)
Immediate Anesthesia Transfer of Care Note  Patient: William Calderon  Procedure(s) Performed: Procedure(s) (LRB): EXCISION MASS (Right)  Patient Location: PACU  Anesthesia Type: General  Level of Consciousness: awake, sedated and patient cooperative  Airway & Oxygen Therapy: Patient Spontanous Breathing and Patient connected to face mask oxygen  Post-op Assessment: Report given to PACU RN and Post -op Vital signs reviewed and stable  Post vital signs: Reviewed and stable  Complications: No apparent anesthesia complications

## 2012-01-28 ENCOUNTER — Encounter (INDEPENDENT_AMBULATORY_CARE_PROVIDER_SITE_OTHER): Payer: Self-pay | Admitting: General Surgery

## 2012-01-30 ENCOUNTER — Other Ambulatory Visit (INDEPENDENT_AMBULATORY_CARE_PROVIDER_SITE_OTHER): Payer: Self-pay | Admitting: Surgery

## 2012-01-30 DIAGNOSIS — C8590 Non-Hodgkin lymphoma, unspecified, unspecified site: Secondary | ICD-10-CM

## 2012-02-02 ENCOUNTER — Telehealth: Payer: Self-pay | Admitting: Oncology

## 2012-02-02 ENCOUNTER — Ambulatory Visit (INDEPENDENT_AMBULATORY_CARE_PROVIDER_SITE_OTHER): Payer: BC Managed Care – PPO | Admitting: Surgery

## 2012-02-02 ENCOUNTER — Encounter (HOSPITAL_COMMUNITY): Payer: Self-pay | Admitting: Surgery

## 2012-02-02 VITALS — BP 162/100 | HR 78 | Temp 98.6°F | Resp 18 | Ht 67.0 in | Wt 263.0 lb

## 2012-02-02 DIAGNOSIS — C8589 Other specified types of non-Hodgkin lymphoma, extranodal and solid organ sites: Secondary | ICD-10-CM

## 2012-02-02 DIAGNOSIS — C8590 Non-Hodgkin lymphoma, unspecified, unspecified site: Secondary | ICD-10-CM

## 2012-02-02 NOTE — Progress Notes (Signed)
Patient ID: William Calderon, male   DOB: 19-Feb-1961, 51 y.o.   MRN: [id number]  S/p excision of right axillary mass on 4/23. 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  The drainage has decreased significantly.  The incision is healing well with no signs of infection.  There is some firm scar tissue forming underneath the incision.  The drain was removed without difficulty.  The patient has an appointment with Dr. Cyndie Chime at the cancer center next week.  Follow-up PRN.  Wilmon Arms. Corliss Skains, MD, [facility]  02/02/2012 4:07 PM

## 2012-02-02 NOTE — Telephone Encounter (Signed)
Talked to pt, he is aware of appt, location and telephone number

## 2012-02-03 ENCOUNTER — Telehealth: Payer: Self-pay | Admitting: Oncology

## 2012-02-03 NOTE — Telephone Encounter (Signed)
Referred by Dr. Stevan Born Dx-Lymphoma

## 2012-02-11 ENCOUNTER — Other Ambulatory Visit (HOSPITAL_BASED_OUTPATIENT_CLINIC_OR_DEPARTMENT_OTHER): Payer: BC Managed Care – PPO | Admitting: Lab

## 2012-02-11 ENCOUNTER — Encounter: Payer: Self-pay | Admitting: Oncology

## 2012-02-11 ENCOUNTER — Telehealth (INDEPENDENT_AMBULATORY_CARE_PROVIDER_SITE_OTHER): Payer: Self-pay

## 2012-02-11 ENCOUNTER — Telehealth: Payer: Self-pay | Admitting: Oncology

## 2012-02-11 ENCOUNTER — Ambulatory Visit (HOSPITAL_BASED_OUTPATIENT_CLINIC_OR_DEPARTMENT_OTHER): Payer: BC Managed Care – PPO | Admitting: Oncology

## 2012-02-11 ENCOUNTER — Ambulatory Visit: Payer: BC Managed Care – PPO

## 2012-02-11 VITALS — BP 161/87 | HR 72 | Temp 97.9°F | Ht 67.0 in | Wt 262.7 lb

## 2012-02-11 DIAGNOSIS — Z951 Presence of aortocoronary bypass graft: Secondary | ICD-10-CM

## 2012-02-11 DIAGNOSIS — C8280 Other types of follicular lymphoma, unspecified site: Secondary | ICD-10-CM

## 2012-02-11 DIAGNOSIS — I252 Old myocardial infarction: Secondary | ICD-10-CM

## 2012-02-11 DIAGNOSIS — E119 Type 2 diabetes mellitus without complications: Secondary | ICD-10-CM

## 2012-02-11 DIAGNOSIS — I639 Cerebral infarction, unspecified: Secondary | ICD-10-CM

## 2012-02-11 DIAGNOSIS — E1151 Type 2 diabetes mellitus with diabetic peripheral angiopathy without gangrene: Secondary | ICD-10-CM | POA: Insufficient documentation

## 2012-02-11 DIAGNOSIS — C8589 Other specified types of non-Hodgkin lymphoma, extranodal and solid organ sites: Secondary | ICD-10-CM

## 2012-02-11 DIAGNOSIS — S31139A Puncture wound of abdominal wall without foreign body, unspecified quadrant without penetration into peritoneal cavity, initial encounter: Secondary | ICD-10-CM

## 2012-02-11 DIAGNOSIS — W3400XA Accidental discharge from unspecified firearms or gun, initial encounter: Secondary | ICD-10-CM | POA: Insufficient documentation

## 2012-02-11 DIAGNOSIS — F17200 Nicotine dependence, unspecified, uncomplicated: Secondary | ICD-10-CM

## 2012-02-11 LAB — CBC & DIFF AND RETIC
Basophils Absolute: 0.1 10*3/uL (ref 0.0–0.1)
EOS%: 3.3 % (ref 0.0–7.0)
HCT: 45.7 % (ref 38.4–49.9)
HGB: 16.3 g/dL (ref 13.0–17.1)
MCH: 31.3 pg (ref 27.2–33.4)
MCV: 87.9 fL (ref 79.3–98.0)
MONO%: 5.4 % (ref 0.0–14.0)
NEUT%: 69.9 % (ref 39.0–75.0)
RBC: 5.2 10*6/uL (ref 4.20–5.82)
RDW: 13 % (ref 11.0–14.6)
Retic Ct Abs: 94.12 10*3/uL — ABNORMAL HIGH (ref 34.80–93.90)

## 2012-02-11 LAB — CHCC SMEAR

## 2012-02-11 LAB — MORPHOLOGY: PLT EST: ADEQUATE

## 2012-02-11 NOTE — Telephone Encounter (Signed)
Gave pt appt for BMBX tomorrow 02/12/12, CT , Pet , labs  And MD. Called [facility] talked to Hunt[place] Dec, [place] they will call pt to schedule porta cath apprt
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
## 2012-02-11 NOTE — Telephone Encounter (Signed)
William Calderon states Dr Cyndie Chime wants a port placed asap.

## 2012-02-12 ENCOUNTER — Ambulatory Visit: Payer: BC Managed Care – PPO | Admitting: *Deleted

## 2012-02-12 ENCOUNTER — Other Ambulatory Visit: Payer: Self-pay | Admitting: Oncology

## 2012-02-12 ENCOUNTER — Other Ambulatory Visit: Payer: BC Managed Care – PPO | Admitting: Lab

## 2012-02-12 ENCOUNTER — Ambulatory Visit (HOSPITAL_COMMUNITY)
Admission: RE | Admit: 2012-02-12 | Discharge: 2012-02-12 | Disposition: A | Discharge status: Home / Self Care | Payer: BC Managed Care – PPO | Source: Ambulatory Visit | Attending: Oncology | Admitting: Oncology

## 2012-02-12 ENCOUNTER — Encounter (HOSPITAL_BASED_OUTPATIENT_CLINIC_OR_DEPARTMENT_OTHER): Payer: Self-pay | Admitting: *Deleted

## 2012-02-12 ENCOUNTER — Ambulatory Visit: Payer: BC Managed Care – PPO | Admitting: Oncology

## 2012-02-12 ENCOUNTER — Encounter: Payer: Self-pay | Admitting: Oncology

## 2012-02-12 ENCOUNTER — Ambulatory Visit (HOSPITAL_COMMUNITY)
Admission: RE | Admit: 2012-02-12 | Discharge: 2012-02-12 | Disposition: A | Discharge status: Home / Self Care | Payer: BC Managed Care – PPO | Source: Ambulatory Visit | Attending: Nurse Practitioner | Admitting: Nurse Practitioner

## 2012-02-12 ENCOUNTER — Other Ambulatory Visit (INDEPENDENT_AMBULATORY_CARE_PROVIDER_SITE_OTHER): Payer: Self-pay | Admitting: Surgery

## 2012-02-12 ENCOUNTER — Ambulatory Visit (HOSPITAL_COMMUNITY): Payer: BC Managed Care – PPO

## 2012-02-12 ENCOUNTER — Other Ambulatory Visit: Payer: Self-pay | Admitting: Nurse Practitioner

## 2012-02-12 VITALS — BP 164/96 | HR 68 | Temp 97.7°F

## 2012-02-12 DIAGNOSIS — I10 Essential (primary) hypertension: Secondary | ICD-10-CM | POA: Insufficient documentation

## 2012-02-12 DIAGNOSIS — C8594 Non-Hodgkin lymphoma, unspecified, lymph nodes of axilla and upper limb: Secondary | ICD-10-CM

## 2012-02-12 DIAGNOSIS — E785 Hyperlipidemia, unspecified: Secondary | ICD-10-CM | POA: Insufficient documentation

## 2012-02-12 DIAGNOSIS — C8590 Non-Hodgkin lymphoma, unspecified, unspecified site: Secondary | ICD-10-CM

## 2012-02-12 DIAGNOSIS — C8589 Other specified types of non-Hodgkin lymphoma, extranodal and solid organ sites: Secondary | ICD-10-CM | POA: Insufficient documentation

## 2012-02-12 LAB — LACTATE DEHYDROGENASE: LDH: 150 U/L (ref 94–250)

## 2012-02-12 LAB — CMV IGM: CMV IgM: 0.3 (ref <0.90)

## 2012-02-12 LAB — COMPREHENSIVE METABOLIC PANEL
ALT: 25 U/L (ref 0–53)
CO2: 24 mEq/L (ref 19–32)
Calcium: 9.5 mg/dL (ref 8.4–10.5)
Chloride: 105 mEq/L (ref 96–112)
Sodium: 140 mEq/L (ref 135–145)
Total Protein: 6.6 g/dL (ref 6.0–8.3)

## 2012-02-12 LAB — HEPATITIS PANEL, ACUTE: Hep A IgM: NEGATIVE

## 2012-02-12 LAB — EPSTEIN-BARR VIRUS VCA, IGG: EBV VCA IgG: 4.55 U/mL — ABNORMAL HIGH

## 2012-02-12 LAB — EPSTEIN-BARR VIRUS EARLY D ANTIGEN ANTIBODY, IGG: EBV EA IgG: 0.29 U/mL

## 2012-02-12 LAB — EPSTEIN-BARR VIRUS NUCLEAR ANTIGEN ANTIBODY, IGG: EBV NA IgG: 2.18 U/mL — ABNORMAL HIGH

## 2012-02-12 MED ORDER — MIDAZOLAM HCL 5 MG/5ML IJ SOLN
INTRAMUSCULAR | Status: AC | PRN
  Administered 2012-02-12 (×2): 2 mg via INTRAVENOUS

## 2012-02-12 MED ORDER — SODIUM CHLORIDE 0.9 % IV SOLN
INTRAVENOUS | Status: DC
  Administered 2012-02-12: 11:00:00 via INTRAVENOUS

## 2012-02-12 MED ORDER — FENTANYL CITRATE 0.05 MG/ML IJ SOLN
INTRAMUSCULAR | Status: AC | PRN
  Administered 2012-02-12 (×2): 100 ug via INTRAVENOUS

## 2012-02-12 NOTE — Progress Notes (Signed)
Bilateral posterior iliac crest bone marrow biopsies attempted. Skin was prepped under sterile technique and 2% lidocaine infused. I was unable to locate either iliac crest.  Procedure was aborted. I will reschedule with interventional Radiology

## 2012-02-12 NOTE — Procedures (Signed)
Successful RT Iliac BM asp and core No comp Stable Path pending Full report in pacs
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
## 2012-02-12 NOTE — Discharge Instructions (Signed)
Do not drive  For 24 hours Do not go into public places today May resume your regular diet and take home medications as usual May experience small amount of tingling in leg (biopsy side) May take shower and remove bandage in am For any questions or concerns, call dr If bleeding occurs at site, hold pressure x10 minutes  If continues, call doctor 

## 2012-02-13 ENCOUNTER — Encounter (HOSPITAL_BASED_OUTPATIENT_CLINIC_OR_DEPARTMENT_OTHER): Payer: Self-pay | Admitting: *Deleted

## 2012-02-13 ENCOUNTER — Ambulatory Visit (HOSPITAL_BASED_OUTPATIENT_CLINIC_OR_DEPARTMENT_OTHER)
Admission: RE | Admit: 2012-02-13 | Discharge: 2012-02-13 | Disposition: A | Discharge status: Home / Self Care | Payer: BC Managed Care – PPO | Source: Ambulatory Visit | Attending: Surgery | Admitting: Surgery

## 2012-02-13 ENCOUNTER — Encounter (HOSPITAL_BASED_OUTPATIENT_CLINIC_OR_DEPARTMENT_OTHER): Payer: Self-pay | Admitting: Anesthesiology

## 2012-02-13 ENCOUNTER — Encounter (HOSPITAL_BASED_OUTPATIENT_CLINIC_OR_DEPARTMENT_OTHER)
Admission: RE | Disposition: A | Discharge status: Home / Self Care | Payer: Self-pay | Source: Ambulatory Visit | Attending: Surgery

## 2012-02-13 ENCOUNTER — Ambulatory Visit (HOSPITAL_COMMUNITY): Payer: BC Managed Care – PPO

## 2012-02-13 ENCOUNTER — Ambulatory Visit (HOSPITAL_BASED_OUTPATIENT_CLINIC_OR_DEPARTMENT_OTHER): Payer: BC Managed Care – PPO | Admitting: Anesthesiology

## 2012-02-13 ENCOUNTER — Other Ambulatory Visit: Payer: Self-pay | Admitting: *Deleted

## 2012-02-13 DIAGNOSIS — C8589 Other specified types of non-Hodgkin lymphoma, extranodal and solid organ sites: Secondary | ICD-10-CM

## 2012-02-13 DIAGNOSIS — C8294 Follicular lymphoma, unspecified, lymph nodes of axilla and upper limb: Secondary | ICD-10-CM | POA: Insufficient documentation

## 2012-02-13 DIAGNOSIS — I2510 Atherosclerotic heart disease of native coronary artery without angina pectoris: Secondary | ICD-10-CM | POA: Insufficient documentation

## 2012-02-13 DIAGNOSIS — C8590 Non-Hodgkin lymphoma, unspecified, unspecified site: Secondary | ICD-10-CM

## 2012-02-13 DIAGNOSIS — Z8673 Personal history of transient ischemic attack (TIA), and cerebral infarction without residual deficits: Secondary | ICD-10-CM | POA: Insufficient documentation

## 2012-02-13 DIAGNOSIS — G4730 Sleep apnea, unspecified: Secondary | ICD-10-CM | POA: Insufficient documentation

## 2012-02-13 DIAGNOSIS — J449 Chronic obstructive pulmonary disease, unspecified: Secondary | ICD-10-CM | POA: Insufficient documentation

## 2012-02-13 DIAGNOSIS — E119 Type 2 diabetes mellitus without complications: Secondary | ICD-10-CM | POA: Insufficient documentation

## 2012-02-13 DIAGNOSIS — I252 Old myocardial infarction: Secondary | ICD-10-CM | POA: Insufficient documentation

## 2012-02-13 DIAGNOSIS — J4489 Other specified chronic obstructive pulmonary disease: Secondary | ICD-10-CM | POA: Insufficient documentation

## 2012-02-13 DIAGNOSIS — I10 Essential (primary) hypertension: Secondary | ICD-10-CM | POA: Insufficient documentation

## 2012-02-13 DIAGNOSIS — F411 Generalized anxiety disorder: Secondary | ICD-10-CM | POA: Insufficient documentation

## 2012-02-13 DIAGNOSIS — F17200 Nicotine dependence, unspecified, uncomplicated: Secondary | ICD-10-CM | POA: Insufficient documentation

## 2012-02-13 HISTORY — PX: PORTACATH PLACEMENT: SHX2246

## 2012-02-13 LAB — POCT I-STAT, CHEM 8
Chloride: 108 mEq/L (ref 96–112)
Hemoglobin: 17 g/dL (ref 13.0–17.0)
Potassium: 3.8 mEq/L (ref 3.5–5.1)
TCO2: 24 mmol/L (ref 0–100)

## 2012-02-13 SURGERY — INSERTION, TUNNELED CENTRAL VENOUS DEVICE, WITH PORT
Anesthesia: General | Site: Chest | Wound class: Clean

## 2012-02-13 MED ORDER — FENTANYL CITRATE 0.05 MG/ML IJ SOLN
INTRAMUSCULAR | Status: DC | PRN
  Administered 2012-02-13: 100 ug via INTRAVENOUS

## 2012-02-13 MED ORDER — CHLORHEXIDINE GLUCONATE 4 % EX LIQD: 1.0000 "application " | Freq: Once | CUTANEOUS | Status: DC

## 2012-02-13 MED ORDER — HEPARIN SOD (PORK) LOCK FLUSH 100 UNIT/ML IV SOLN
INTRAVENOUS | Status: DC | PRN
  Administered 2012-02-13: 500 [IU] via INTRAVENOUS

## 2012-02-13 MED ORDER — ONDANSETRON 8 MG PO TBDP: 8.0000 mg | ORAL_TABLET | Freq: Three times a day (TID) | ORAL | Status: AC | PRN

## 2012-02-13 MED ORDER — PROPOFOL 10 MG/ML IV EMUL
INTRAVENOUS | Status: DC | PRN
  Administered 2012-02-13: 200 mg via INTRAVENOUS

## 2012-02-13 MED ORDER — CEFAZOLIN SODIUM-DEXTROSE 2-3 GM-% IV SOLR
2.0000 g | INTRAVENOUS | Status: AC
  Administered 2012-02-13: 2 g via INTRAVENOUS

## 2012-02-13 MED ORDER — MIDAZOLAM HCL 5 MG/5ML IJ SOLN
INTRAMUSCULAR | Status: DC | PRN
  Administered 2012-02-13: 2 mg via INTRAVENOUS

## 2012-02-13 MED ORDER — ONDANSETRON HCL 4 MG/2ML IJ SOLN
INTRAMUSCULAR | Status: DC | PRN
  Administered 2012-02-13: 4 mg via INTRAVENOUS

## 2012-02-13 MED ORDER — ALLOPURINOL 300 MG PO TABS: 300.0000 mg | ORAL_TABLET | Freq: Every day | ORAL | Status: DC

## 2012-02-13 MED ORDER — BUPIVACAINE-EPINEPHRINE 0.25% -1:200000 IJ SOLN
INTRAMUSCULAR | Status: DC | PRN
  Administered 2012-02-13: 10 mL

## 2012-02-13 MED ORDER — LACTATED RINGERS IV SOLN
INTRAVENOUS | Status: DC
  Administered 2012-02-13 (×2): via INTRAVENOUS

## 2012-02-13 MED ORDER — MIDAZOLAM HCL 2 MG/2ML IJ SOLN: 1.0000 mg | INTRAMUSCULAR | Status: DC | PRN

## 2012-02-13 MED ORDER — PREDNISONE 20 MG PO TABS: ORAL_TABLET | ORAL | Status: DC

## 2012-02-13 MED ORDER — HEPARIN (PORCINE) IN NACL 2-0.9 UNIT/ML-% IJ SOLN
INTRAMUSCULAR | Status: DC | PRN
  Administered 2012-02-13: 1 via INTRAVENOUS

## 2012-02-13 MED ORDER — FENTANYL CITRATE 0.05 MG/ML IJ SOLN: 50.0000 ug | INTRAMUSCULAR | Status: DC | PRN

## 2012-02-13 MED ORDER — LIDOCAINE HCL (CARDIAC) 20 MG/ML IV SOLN
INTRAVENOUS | Status: DC | PRN
  Administered 2012-02-13: 100 mg via INTRAVENOUS

## 2012-02-13 SURGICAL SUPPLY — 52 items
APL SKNCLS STERI-STRIP NONHPOA (GAUZE/BANDAGES/DRESSINGS) ×1
BAG DECANTER FOR FLEXI CONT (MISCELLANEOUS) ×2 IMPLANT
BENZOIN TINCTURE PRP APPL 2/3 (GAUZE/BANDAGES/DRESSINGS) ×2 IMPLANT
BLADE SURG 11 STRL SS (BLADE) ×2 IMPLANT
BLADE SURG 15 STRL LF DISP TIS (BLADE) ×1 IMPLANT
BLADE SURG 15 STRL SS (BLADE) ×2
CANISTER SUCTION 1200CC (MISCELLANEOUS) IMPLANT
CATH SINGLE LUMEN 9.6F (PORTABLE EQUIPMENT SUPPLIES) IMPLANT
CHLORAPREP W/TINT 26ML (MISCELLANEOUS) ×2 IMPLANT
CLEANER CAUTERY TIP 5X5 PAD (MISCELLANEOUS) ×1 IMPLANT
CLOTH BEACON ORANGE TIMEOUT ST (SAFETY) ×2 IMPLANT
COVER MAYO STAND STRL (DRAPES) ×2 IMPLANT
COVER TABLE BACK 60X90 (DRAPES) ×2 IMPLANT
DECANTER SPIKE VIAL GLASS SM (MISCELLANEOUS) IMPLANT
DRAPE C-ARM 42X72 X-RAY (DRAPES) ×2 IMPLANT
DRAPE LAPAROTOMY TRNSV 102X78 (DRAPE) ×2 IMPLANT
DRAPE UTILITY XL STRL (DRAPES) ×2 IMPLANT
DRSG TEGADERM 4X4.75 (GAUZE/BANDAGES/DRESSINGS) ×2 IMPLANT
ELECT REM PT RETURN 9FT ADLT (ELECTROSURGICAL) ×2
ELECTRODE REM PT RTRN 9FT ADLT (ELECTROSURGICAL) ×1 IMPLANT
GAUZE SPONGE 4X4 12PLY STRL LF (GAUZE/BANDAGES/DRESSINGS) IMPLANT
GLOVE BIO SURGEON STRL SZ 6.5 (GLOVE) ×1 IMPLANT
GLOVE BIO SURGEON STRL SZ7 (GLOVE) ×2 IMPLANT
GLOVE BIOGEL PI IND STRL 7.5 (GLOVE) ×1 IMPLANT
GLOVE BIOGEL PI INDICATOR 7.5 (GLOVE) ×1
GOWN PREVENTION PLUS XLARGE (GOWN DISPOSABLE) ×3 IMPLANT
IV HEPARIN 1000UNITS/500ML (IV SOLUTION) ×2 IMPLANT
IV KIT MINILOC 20X1 SAFETY (NEEDLE) IMPLANT
KIT POWER CATH 8FR (Catheter) ×2 IMPLANT
NDL HYPO 25X1 1.5 SAFETY (NEEDLE) ×1 IMPLANT
NDL SAFETY ECLIPSE 18X1.5 (NEEDLE) IMPLANT
NDL SPNL 22GX3.5 QUINCKE BK (NEEDLE) IMPLANT
NEEDLE HYPO 18GX1.5 SHARP (NEEDLE)
NEEDLE HYPO 25X1 1.5 SAFETY (NEEDLE) ×2 IMPLANT
NEEDLE SPNL 22GX3.5 QUINCKE BK (NEEDLE) IMPLANT
PACK BASIN DAY SURGERY FS (CUSTOM PROCEDURE TRAY) ×2 IMPLANT
PAD CLEANER CAUTERY TIP 5X5 (MISCELLANEOUS) ×1
PENCIL BUTTON HOLSTER BLD 10FT (ELECTRODE) ×2 IMPLANT
SLEEVE SCD COMPRESS KNEE MED (MISCELLANEOUS) ×2 IMPLANT
SPONGE GAUZE 2X2 8PLY STRL LF (GAUZE/BANDAGES/DRESSINGS) ×2 IMPLANT
STRIP CLOSURE SKIN 1/2X4 (GAUZE/BANDAGES/DRESSINGS) ×2 IMPLANT
SUT MON AB 4-0 PC3 18 (SUTURE) ×2 IMPLANT
SUT PROLENE 2 0 CT2 30 (SUTURE) ×2 IMPLANT
SUT VIC AB 3-0 SH 27 (SUTURE) ×2
SUT VIC AB 3-0 SH 27X BRD (SUTURE) ×1 IMPLANT
SYR 5ML LUER SLIP (SYRINGE) ×2 IMPLANT
SYR CONTROL 10ML LL (SYRINGE) ×2 IMPLANT
TOWEL OR 17X24 6PK STRL BLUE (TOWEL DISPOSABLE) ×2 IMPLANT
TOWEL OR NON WOVEN STRL DISP B (DISPOSABLE) ×2 IMPLANT
TUBE CONNECTING 20X1/4 (TUBING) IMPLANT
WATER STERILE IRR 1000ML POUR (IV SOLUTION) ×1 IMPLANT
YANKAUER SUCT BULB TIP NO VENT (SUCTIONS) IMPLANT

## 2012-02-13 NOTE — Anesthesia Procedure Notes (Signed)
Procedure Name: LMA Insertion Date/Time: 02/13/2012 10:51 AM Performed by: Zenia Resides D Pre-anesthesia Checklist: Patient identified, Emergency Drugs available, Suction available, Patient being monitored and Timeout performed Patient Re-evaluated:Patient Re-evaluated prior to inductionOxygen Delivery Method: Circle System Utilized Preoxygenation: Pre-oxygenation with 100% oxygen Intubation Type: IV induction Ventilation: Mask ventilation without difficulty LMA: LMA with gastric port inserted LMA Size: 5.0 Number of attempts: 1 Placement Confirmation: positive ETCO2,  CO2 detector and breath sounds checked- equal and bilateral Tube secured with: Tape Dental Injury: Teeth and Oropharynx as per pre-operative assessment
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
## 2012-02-13 NOTE — Interval H&P Note (Signed)
History and Physical Interval Note:  02/13/2012 9:31 AM  William Calderon  has presented today for surgery, with the diagnosis of lymphoma  The various methods of treatment have been discussed with the patient and family. After consideration of risks, benefits and other options for treatment, the patient has consented to  Procedure(s) (LRB): INSERTION PORT-A-CATH (N/A) as a surgical intervention .  The patients' history has been reviewed, patient examined, no change in status, stable for surgery.  I have reviewed the patients' chart and labs.  Questions were answered to the patient's satisfaction.    He has been evaluated by Dr. Cyndie Chime and chemotherapy is planned.  We will place a port today for access.  Maressa Apollo K.

## 2012-02-13 NOTE — Anesthesia Preprocedure Evaluation (Signed)
Anesthesia Evaluation  Patient identified by MRN, date of birth, ID band Patient awake    Reviewed: Allergy & Precautions, H&P , NPO status , Patient's Chart, lab work & pertinent test results  Airway Mallampati: I TM Distance: >3 FB Neck ROM: Full    Dental   Pulmonary sleep apnea , COPDCurrent Smoker,  lymphoma + rhonchi   + decreased breath sounds      Cardiovascular hypertension, + CAD and + Past MI     Neuro/Psych Anxiety CVA    GI/Hepatic   Endo/Other  Diabetes mellitus-  Renal/GU      Musculoskeletal   Abdominal   Peds  Hematology   Anesthesia Other Findings   Reproductive/Obstetrics                           Anesthesia Physical Anesthesia Plan  ASA: IV  Anesthesia Plan: General   Post-op Pain Management:    Induction: Intravenous  Airway Management Planned: LMA  Additional Equipment:   Intra-op Plan:   Post-operative Plan: Extubation in OR  Informed Consent: I have reviewed the patients History and Physical, chart, labs and discussed the procedure including the risks, benefits and alternatives for the proposed anesthesia with the patient or authorized representative who has indicated his/her understanding and acceptance.     Plan Discussed with: CRNA and Surgeon  Anesthesia Plan Comments:         Anesthesia Quick Evaluation

## 2012-02-13 NOTE — Discharge Instructions (Signed)

## 2012-02-13 NOTE — Transfer of Care (Signed)
Immediate Anesthesia Transfer of Care Note  Patient: William Calderon  Procedure(s) Performed: Procedure(s) (LRB): INSERTION PORT-A-CATH (N/A)  Patient Location: PACU  Anesthesia Type: General  Level of Consciousness: awake  Airway & Oxygen Therapy: Patient Spontanous Breathing and Patient connected to face mask oxygen  Post-op Assessment: Report given to PACU RN and Post -op Vital signs reviewed and stable  Post vital signs: Reviewed and stable  Complications: No apparent anesthesia complications

## 2012-02-13 NOTE — Progress Notes (Signed)
Spoke with Dr. Corliss Skains in regards to CXR results-results to be forwarded to pt.'s oncologist by Dr. Corliss Skains.

## 2012-02-13 NOTE — Op Note (Addendum)
Preop diagnosis: Lymphoma Postop diagnosis: Same Procedure performed: Left subclavian vein port placement Surgeon:Tiffnay Bossi K. Anesthesia: Gen. via LMA Indications: This is a 51 year old male who recently presented with an enlarging mass under his right axilla. He underwent excision of this mass. 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 He is planning to undergo treatment and presents now for port placement.  Description of procedure: The patient was brought to the operating room and placed in a supine position on the operating room table with his arms tucked. After an adequate level of general anesthesia was obtained his left chest was shaved prepped with chlor prep and draped sterile fashion. A timeout was taken to ensure the proper patient proper procedure. A roll had been previously placed behind his shoulders. We placed him in Trendelenburg position. The area under the clavicle was infiltrated with 0.25% Marcaine with epinephrine. The patient states a small lipoma in this area but we're able to avoid it. An 18-gauge needle was used to cannulate the left subclavian vein on the first pass. Good blood return was noted. The wire was passed under fluoroscopic guidance through the needle and was seen to go down the superior vena cava. No ectopy was noted. We then made a transverse incision on the chest wall and created a subcutaneous pocket. We excised some of the subcutaneous fat. Once the pocket was large enough for the port we created a subcutaneous tunnel up to the insertion site. The port and 8 French attachable catheter were brought on the field. We tunneled the catheter up to the insertion site from the subcutaneous pocket. We connected the catheter to the port in place a port within the subcutaneous pocket. This was sutured in place with 2-0 Prolene. We then estimated the length of the catheter using fluoroscopy. We cut the catheter at the appropriate length. We then passed the  dilator and breakaway sheath over the wire under fluoroscopic guidance. The catheter was then passed through the breakaway sheath after the dilator and wire were removed. The sheath was removed. We were able to aspirate blood easily and were flushed heparinized saline easily. Fluoroscopic examination showed no sign of any kinks in the catheter. However we seem to be too deep into the right atrium. We then cut the sutures holding the port in place. We clamped the catheter with a hemostat and detached the port. We removed about 5 cm of the catheter and then reattached port. Fluoroscopic examination showed that the tip of the catheter is at the cavoatrial junction. The port was then resutured into place. We again aspirated and flushed easily. Concentrated heparin was instilled into the catheter. The wound was then closed with 3-0 Vicryl as well as a subcuticular 4-0 Monocryl. Steri-Strips and clean dressings were applied. The patient was extubated and brought to recovery stable condition. All sponge instrument and needle counts correct.  Wilmon Arms. Corliss Skains, MD, [facility] Surgery  02/13/2012 11:56 AM

## 2012-02-13 NOTE — Telephone Encounter (Signed)
Dr. Cyndie Chime gave pt a script 02/11/12 stating Leave of Absence from work to take chemotherapy treamtments x 6 months.

## 2012-02-13 NOTE — Anesthesia Postprocedure Evaluation (Signed)
  Anesthesia Post-op Note  Patient: William Calderon  Procedure(s) Performed: Procedure(s) (LRB): INSERTION PORT-A-CATH (N/A)  Patient Location: PACU  Anesthesia Type: General  Level of Consciousness: awake  Airway and Oxygen Therapy: Patient Spontanous Breathing  Post-op Pain: mild  Post-op Assessment: Post-op Vital signs reviewed, Patient's Cardiovascular Status Stable, Respiratory Function Stable, Patent Airway, No signs of Nausea or vomiting, Adequate PO intake and Pain level controlled  Post-op Vital Signs: stable  Complications: No apparent anesthesia complications

## 2012-02-16 ENCOUNTER — Ambulatory Visit (HOSPITAL_COMMUNITY)
Admission: RE | Admit: 2012-02-16 | Discharge: 2012-02-16 | Disposition: A | Discharge status: Home / Self Care | Payer: BC Managed Care – PPO | Source: Ambulatory Visit | Attending: Oncology | Admitting: Oncology

## 2012-02-16 ENCOUNTER — Encounter (HOSPITAL_BASED_OUTPATIENT_CLINIC_OR_DEPARTMENT_OTHER): Payer: Self-pay | Admitting: Surgery

## 2012-02-16 DIAGNOSIS — C8280 Other types of follicular lymphoma, unspecified site: Secondary | ICD-10-CM

## 2012-02-16 DIAGNOSIS — Z9089 Acquired absence of other organs: Secondary | ICD-10-CM | POA: Insufficient documentation

## 2012-02-16 DIAGNOSIS — C8589 Other specified types of non-Hodgkin lymphoma, extranodal and solid organ sites: Secondary | ICD-10-CM | POA: Insufficient documentation

## 2012-02-16 DIAGNOSIS — E041 Nontoxic single thyroid nodule: Secondary | ICD-10-CM | POA: Insufficient documentation

## 2012-02-16 DIAGNOSIS — I2510 Atherosclerotic heart disease of native coronary artery without angina pectoris: Secondary | ICD-10-CM | POA: Insufficient documentation

## 2012-02-16 DIAGNOSIS — Z951 Presence of aortocoronary bypass graft: Secondary | ICD-10-CM | POA: Insufficient documentation

## 2012-02-16 DIAGNOSIS — Q619 Cystic kidney disease, unspecified: Secondary | ICD-10-CM | POA: Insufficient documentation

## 2012-02-16 MED ORDER — IOHEXOL 300 MG/ML  SOLN
100.0000 mL | Freq: Once | INTRAMUSCULAR | Status: AC | PRN
  Administered 2012-02-16: 100 mL via INTRAVENOUS

## 2012-02-17 ENCOUNTER — Encounter: Payer: Self-pay | Admitting: *Deleted

## 2012-02-17 ENCOUNTER — Other Ambulatory Visit: Payer: BC Managed Care – PPO

## 2012-02-18 ENCOUNTER — Telehealth: Payer: Self-pay | Admitting: *Deleted

## 2012-02-18 ENCOUNTER — Other Ambulatory Visit: Payer: Self-pay | Admitting: Oncology

## 2012-02-18 DIAGNOSIS — C8590 Non-Hodgkin lymphoma, unspecified, unspecified site: Secondary | ICD-10-CM

## 2012-02-18 DIAGNOSIS — I2510 Atherosclerotic heart disease of native coronary artery without angina pectoris: Secondary | ICD-10-CM

## 2012-02-18 NOTE — Telephone Encounter (Signed)
Message copied by Sabino Snipes on Wed Feb 18, 2012  3:51 PM ------      Message from: Levert Feinstein      Created: Mon Feb 16, 2012  2:56 PM       Call pt bone marrow negative for lymphoma

## 2012-02-18 NOTE — Telephone Encounter (Signed)
Pt notified of BMBX results per Dr. Cyndie Chime.  He was very happy.

## 2012-02-19 ENCOUNTER — Encounter (HOSPITAL_COMMUNITY)
Admission: RE | Admit: 2012-02-19 | Discharge: 2012-02-19 | Disposition: A | Discharge status: Home / Self Care | Payer: BC Managed Care – PPO | Source: Ambulatory Visit | Attending: Oncology | Admitting: Oncology

## 2012-02-19 DIAGNOSIS — C8280 Other types of follicular lymphoma, unspecified site: Secondary | ICD-10-CM

## 2012-02-19 DIAGNOSIS — R599 Enlarged lymph nodes, unspecified: Secondary | ICD-10-CM | POA: Insufficient documentation

## 2012-02-19 DIAGNOSIS — C8589 Other specified types of non-Hodgkin lymphoma, extranodal and solid organ sites: Secondary | ICD-10-CM | POA: Insufficient documentation

## 2012-02-19 LAB — GLUCOSE, CAPILLARY: Glucose-Capillary: 154 mg/dL — ABNORMAL HIGH (ref 70–99)

## 2012-02-19 MED ORDER — FLUDEOXYGLUCOSE F - 18 (FDG) INJECTION
16.8000 | Freq: Once | INTRAVENOUS | Status: AC | PRN
  Administered 2012-02-19: 16.8 via INTRAVENOUS

## 2012-02-25 ENCOUNTER — Telehealth: Payer: Self-pay | Admitting: Oncology

## 2012-02-25 NOTE — Telephone Encounter (Signed)
Talked to pt, gave him appt for Cardiac MRI for 6/3 @ [facility] 1pm

## 2012-02-27 ENCOUNTER — Encounter: Payer: Self-pay | Admitting: Oncology

## 2012-03-02 ENCOUNTER — Other Ambulatory Visit (HOSPITAL_BASED_OUTPATIENT_CLINIC_OR_DEPARTMENT_OTHER): Payer: BC Managed Care – PPO | Admitting: Lab

## 2012-03-02 ENCOUNTER — Telehealth: Payer: Self-pay | Admitting: Oncology

## 2012-03-02 ENCOUNTER — Ambulatory Visit (HOSPITAL_BASED_OUTPATIENT_CLINIC_OR_DEPARTMENT_OTHER): Payer: BC Managed Care – PPO | Admitting: Oncology

## 2012-03-02 VITALS — BP 140/78 | HR 70 | Temp 97.4°F | Ht 67.0 in | Wt 260.7 lb

## 2012-03-02 DIAGNOSIS — E119 Type 2 diabetes mellitus without complications: Secondary | ICD-10-CM

## 2012-03-02 DIAGNOSIS — C8589 Other specified types of non-Hodgkin lymphoma, extranodal and solid organ sites: Secondary | ICD-10-CM

## 2012-03-02 DIAGNOSIS — I2510 Atherosclerotic heart disease of native coronary artery without angina pectoris: Secondary | ICD-10-CM

## 2012-03-02 DIAGNOSIS — C8280 Other types of follicular lymphoma, unspecified site: Secondary | ICD-10-CM

## 2012-03-02 DIAGNOSIS — C8299 Follicular lymphoma, unspecified, extranodal and solid organ sites: Secondary | ICD-10-CM

## 2012-03-02 DIAGNOSIS — I10 Essential (primary) hypertension: Secondary | ICD-10-CM

## 2012-03-02 DIAGNOSIS — I428 Other cardiomyopathies: Secondary | ICD-10-CM

## 2012-03-02 LAB — CBC WITH DIFFERENTIAL/PLATELET
EOS%: 3.1 % (ref 0.0–7.0)
HCT: 47 % (ref 38.4–49.9)
HGB: 16.4 g/dL (ref 13.0–17.1)
MONO%: 5.9 % (ref 0.0–14.0)
NEUT#: 8.8 10*3/uL — ABNORMAL HIGH (ref 1.5–6.5)
Platelets: 184 10*3/uL (ref 140–400)
RBC: 5.19 10*6/uL (ref 4.20–5.82)
RDW: 13 % (ref 11.0–14.6)
WBC: 11.6 10*3/uL — ABNORMAL HIGH (ref 4.0–10.3)
nRBC: 0 % (ref 0–0)

## 2012-03-02 NOTE — Progress Notes (Signed)
Hematology and Oncology Follow Up Visit  William Calderon [id number] 09-10-1961 51 y.o. 03/02/2012 3:07 PM   Principle Diagnosis: Encounter Diagnoses  Name Primary?  . Nodular High Grade B-Cell Lymphoma Yes  . DM type 2 (diabetes mellitus, type 2)   . CAD (coronary artery disease)      Interim History:   Short interim followup visit for this 51 year old man with newly diagnosed follicular grade 3 B-cell non-Hodgkin's lymphoma. His staging evaluation is now complete. I was pleasantly surprised to see his PET scan which did not show any disease outside of the right axilla. This was confirmed on CT scan as well. His bone marrow biopsy was negative. This makes him a stage IA. Serum LDH was normal at 150. This makes his international prognostic index low-risk. He is here today with his wife to discuss definitive treatment. His surgeon placed Port-A-Cath infusion device. No other interim problems. In view of his low ejection fraction on recent stress echocardiogram, I did get him scheduled for a cardiac MRI to get a better estimate of his cardiac reserve.  Medications: reviewed  Allergies:   Physical Exam: Blood pressure 140/78, pulse 70, temperature 97.4 F (36.3 C), temperature source Oral, height 5\' 7"  (1.702 m), weight 260 lb 11.2 oz (118.253 kg). Wt Readings from Last 3 Encounters:  03/02/12 260 lb 11.2 oz (118.253 kg)  02/11/12 262 lb 11.2 oz (119.16 kg)  02/02/12 263 lb (119.296 kg)     General appearance: Overweight Caucasian man [name]:  Pharynx no erythema or exudate Lymph nodes: Well-healed scar right axilla. Residual adenopathy proximally 4 cm right axilla. No cervical supraclavicular or left axillary adenopathy Breasts: Lungs: Clear to auscultation resonant to percussion Heart: Regular rhythm no murmur or gallop Abdomen: Soft obese nontender no mass no organomegaly Extremities: No edema no calf tenderness Vascular: No cyanosis Neurologic: Motor strength 5 over 5  reflexes 1+ symmetric Skin: No rash or ecchymosis  Lab Results: Lab Results  Component Value Date   WBC 11.6* 03/02/2012   HGB 16.4 03/02/2012   HCT 47.0 03/02/2012   MCV 90.6 03/02/2012   PLT 184 03/02/2012     Chemistry      Component Value Date/Time   NA 141 02/13/2012 1006   K 3.8 02/13/2012 1006   CL 108 02/13/2012 1006   CO2 24 02/11/2012 1337   BUN 12 02/13/2012 1006   CREATININE 0.80 02/13/2012 1006      Component Value Date/Time   CALCIUM 9.5 02/11/2012 1337   ALKPHOS 69 02/11/2012 1337   AST 21 02/11/2012 1337   ALT 25 02/11/2012 1337   BILITOT 0.5 02/11/2012 1337       Radiological Studies: See discussion above    Impression and Plan: #1. Follicular grade 3, B-cell non-Hodgkin's lymphoma, stage IA, IPI low-risk I outlined a treatment plan with chemotherapy to include Cytoxan, vincristine, etoposide, and prednisone substituting Adriamycin for the etoposide in view of his depressed cardiac function. Followed by involved field radiation. Once again he is advised that his glucose will rise further on steroids. He has not been on treatment for diabetes on up until now but I will get in touch with his primary care physician so that we can work together. I would like to get treatment started right away. First cycle to begin tomorrow May 29. Day 1 of Cytoxan and vincristine;  day 1, 2, 3, etoposide. Day 1 through 5 prednisone 100 mg in divided doses 60 mg after breakfast 40 mg after lunch. Allopurinol 300  mg daily for the first month. He has had a chemotherapy education session with our nurses. I will plan on for chemotherapy cycles at three-week intervals and then refer him for involved field radiation.  He has no additional questions at this time.  #2.  previously untreated diabetes. Random glucoses as high as 206 over the last 2 months. Plan: sollicit to help with his family physician to get him on glucose lowering medication and make appropriate adjustments on the days that he get  steroids during his chemotherapy program.  #3. Coronary artery disease status post MI status post bypass surgery  #4. 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 Essential hypertension  #6. Tobacco addiction  #7. Hyperlipidemia  #8. Cerebrovascular disease status post stroke  #9. Sleep apnea syndrome   CC:. Prudy Feeler with Dr. Samuel Jester [facility] in [facility]; Dr. Manus Rudd   Levert Feinstein, MD 5/28/20133:07 PM

## 2012-03-02 NOTE — Progress Notes (Signed)
Addendum to office progress note: Rituxan anti-B-cell antibody will be part of his treatment program. Due to scheduling conflicts I will not be able to start his first treatment until Monday, June 3. I did get in touch Randon Goldsmith, physician assistant working with Dr. Samuel Jester, and she will contact the patient to start diabetes education and treatment.

## 2012-03-02 NOTE — Telephone Encounter (Signed)
appts made and printed for pt aom °

## 2012-03-03 ENCOUNTER — Ambulatory Visit: Payer: BC Managed Care – PPO

## 2012-03-03 ENCOUNTER — Other Ambulatory Visit: Payer: Self-pay | Admitting: Oncology

## 2012-03-03 ENCOUNTER — Telehealth: Payer: Self-pay | Admitting: *Deleted

## 2012-03-03 NOTE — Telephone Encounter (Signed)
I have called and gave the patient his appts for next week.  JMW 

## 2012-03-04 ENCOUNTER — Ambulatory Visit: Payer: BC Managed Care – PPO

## 2012-03-04 ENCOUNTER — Other Ambulatory Visit: Payer: Self-pay | Admitting: Oncology

## 2012-03-05 ENCOUNTER — Ambulatory Visit: Payer: BC Managed Care – PPO

## 2012-03-08 ENCOUNTER — Ambulatory Visit (HOSPITAL_COMMUNITY)
Admission: RE | Admit: 2012-03-08 | Discharge: 2012-03-08 | Disposition: A | Discharge status: Home / Self Care | Payer: Medicaid Other | Source: Ambulatory Visit | Attending: Oncology | Admitting: Oncology

## 2012-03-08 DIAGNOSIS — I2510 Atherosclerotic heart disease of native coronary artery without angina pectoris: Secondary | ICD-10-CM

## 2012-03-08 DIAGNOSIS — C8590 Non-Hodgkin lymphoma, unspecified, unspecified site: Secondary | ICD-10-CM

## 2012-03-09 ENCOUNTER — Other Ambulatory Visit: Payer: Self-pay | Admitting: Oncology

## 2012-03-09 ENCOUNTER — Ambulatory Visit (HOSPITAL_BASED_OUTPATIENT_CLINIC_OR_DEPARTMENT_OTHER): Payer: BC Managed Care – PPO

## 2012-03-09 VITALS — BP 155/81 | HR 77 | Temp 97.5°F

## 2012-03-09 DIAGNOSIS — I639 Cerebral infarction, unspecified: Secondary | ICD-10-CM

## 2012-03-09 DIAGNOSIS — Z5111 Encounter for antineoplastic chemotherapy: Secondary | ICD-10-CM

## 2012-03-09 DIAGNOSIS — I10 Essential (primary) hypertension: Secondary | ICD-10-CM

## 2012-03-09 DIAGNOSIS — C8280 Other types of follicular lymphoma, unspecified site: Secondary | ICD-10-CM

## 2012-03-09 DIAGNOSIS — E119 Type 2 diabetes mellitus without complications: Secondary | ICD-10-CM

## 2012-03-09 DIAGNOSIS — Z951 Presence of aortocoronary bypass graft: Secondary | ICD-10-CM

## 2012-03-09 DIAGNOSIS — I252 Old myocardial infarction: Secondary | ICD-10-CM

## 2012-03-09 DIAGNOSIS — C8589 Other specified types of non-Hodgkin lymphoma, extranodal and solid organ sites: Secondary | ICD-10-CM

## 2012-03-09 MED ORDER — DIPHENHYDRAMINE HCL 25 MG PO CAPS
50.0000 mg | ORAL_CAPSULE | Freq: Once | ORAL | Status: AC
  Administered 2012-03-09: 50 mg via ORAL

## 2012-03-09 MED ORDER — HEPARIN SOD (PORK) LOCK FLUSH 100 UNIT/ML IV SOLN
500.0000 [IU] | Freq: Once | INTRAVENOUS | Status: AC | PRN
  Administered 2012-03-09: 500 [IU]
  Filled 2012-03-09: qty 5

## 2012-03-09 MED ORDER — ACETAMINOPHEN 325 MG PO TABS
650.0000 mg | ORAL_TABLET | Freq: Once | ORAL | Status: AC
  Administered 2012-03-09: 650 mg via ORAL

## 2012-03-09 MED ORDER — SODIUM CHLORIDE 0.9 % IV SOLN
50.0000 mg/m2 | Freq: Once | INTRAVENOUS | Status: AC
  Administered 2012-03-09: 120 mg via INTRAVENOUS
  Filled 2012-03-09: qty 6

## 2012-03-09 MED ORDER — SODIUM CHLORIDE 0.9 % IJ SOLN
10.0000 mL | INTRAMUSCULAR | Status: DC | PRN
  Administered 2012-03-09: 10 mL
  Filled 2012-03-09: qty 10

## 2012-03-09 MED ORDER — DEXAMETHASONE SODIUM PHOSPHATE 4 MG/ML IJ SOLN
20.0000 mg | Freq: Once | INTRAMUSCULAR | Status: AC
  Administered 2012-03-09: 20 mg via INTRAVENOUS

## 2012-03-09 MED ORDER — FAMOTIDINE IN NACL 20-0.9 MG/50ML-% IV SOLN
20.0000 mg | Freq: Once | INTRAVENOUS | Status: AC | PRN
  Administered 2012-03-09: 20 mg via INTRAVENOUS

## 2012-03-09 MED ORDER — SODIUM CHLORIDE 0.9 % IV SOLN
Freq: Once | INTRAVENOUS | Status: AC
  Administered 2012-03-09: 10:00:00 via INTRAVENOUS

## 2012-03-09 MED ORDER — METHYLPREDNISOLONE SODIUM SUCC 125 MG IJ SOLR
125.0000 mg | Freq: Once | INTRAMUSCULAR | Status: AC | PRN
  Administered 2012-03-09: 125 mg via INTRAVENOUS

## 2012-03-09 MED ORDER — SODIUM CHLORIDE 0.9 % IV SOLN
375.0000 mg/m2 | Freq: Once | INTRAVENOUS | Status: AC
  Administered 2012-03-09: 900 mg via INTRAVENOUS
  Filled 2012-03-09: qty 90

## 2012-03-09 MED ORDER — SODIUM CHLORIDE 0.9 % IV SOLN
750.0000 mg/m2 | Freq: Once | INTRAVENOUS | Status: AC
  Administered 2012-03-09: 1780 mg via INTRAVENOUS
  Filled 2012-03-09: qty 89

## 2012-03-09 MED ORDER — ONDANSETRON 16 MG/50ML IVPB (CHCC)
16.0000 mg | Freq: Once | INTRAVENOUS | Status: AC
  Administered 2012-03-09: 16 mg via INTRAVENOUS

## 2012-03-09 MED ORDER — VINCRISTINE SULFATE CHEMO INJECTION 1 MG/ML
2.0000 mg | Freq: Once | INTRAVENOUS | Status: AC
  Administered 2012-03-09: 2 mg via INTRAVENOUS
  Filled 2012-03-09: qty 2

## 2012-03-09 NOTE — Progress Notes (Signed)
rituxan rate increased to 50ml's/hr for 48 ml's = 250mg 's/hr.

## 2012-03-09 NOTE — Progress Notes (Signed)
rituxan rate increased to 77 ml's/hr for 38 ml's = 200 mg/hr

## 2012-03-09 NOTE — Progress Notes (Signed)
rituxan rate increased to 75ml's/hr for 29 ml's = 150 mg/hr

## 2012-03-09 NOTE — Progress Notes (Signed)
See progress note concerning reaction. Medication stopped.

## 2012-03-09 NOTE — Progress Notes (Signed)
rituxan rate increased to 25ml's/hr for 19 ml's = 100mg /hr

## 2012-03-09 NOTE — Progress Notes (Signed)
Pt called RN into room with complaints of itching.  Rituxan stopped immediately.  At the time (1130) pt was scratching all over but had no visible rash.  Dr. Truett Perna notified, orders to stop Rituxan and see if itching will subside.  RN entered patients room after and at this time(1135) it was noted that patient has widespread rash to face, back, groin (severe) and chest area.  Pt's eyelids are also swollen.  Dr. Truett Perna notified of new findings.  Orders given for solumedrol and pepcid.  Meds given per protocol.  RN will continue to monitor patient.  Addendum:  Pt's rash clear and itching stopped so rituxan restarted at 1215.  Pt stable through first 30 minutes of re-challenge so rate increased per protocol.  Pt will be monitored throughout remainder of treatment. shk

## 2012-03-10 ENCOUNTER — Ambulatory Visit (HOSPITAL_BASED_OUTPATIENT_CLINIC_OR_DEPARTMENT_OTHER): Payer: BC Managed Care – PPO

## 2012-03-10 VITALS — BP 150/80 | HR 85 | Temp 97.9°F

## 2012-03-10 DIAGNOSIS — Z5111 Encounter for antineoplastic chemotherapy: Secondary | ICD-10-CM

## 2012-03-10 DIAGNOSIS — I252 Old myocardial infarction: Secondary | ICD-10-CM

## 2012-03-10 DIAGNOSIS — I639 Cerebral infarction, unspecified: Secondary | ICD-10-CM

## 2012-03-10 DIAGNOSIS — C8280 Other types of follicular lymphoma, unspecified site: Secondary | ICD-10-CM

## 2012-03-10 DIAGNOSIS — I10 Essential (primary) hypertension: Secondary | ICD-10-CM

## 2012-03-10 DIAGNOSIS — C8299 Follicular lymphoma, unspecified, extranodal and solid organ sites: Secondary | ICD-10-CM

## 2012-03-10 DIAGNOSIS — Z951 Presence of aortocoronary bypass graft: Secondary | ICD-10-CM

## 2012-03-10 DIAGNOSIS — E119 Type 2 diabetes mellitus without complications: Secondary | ICD-10-CM

## 2012-03-10 MED ORDER — ONDANSETRON 8 MG/50ML IVPB (CHCC)
8.0000 mg | Freq: Once | INTRAVENOUS | Status: AC
  Administered 2012-03-10: 8 mg via INTRAVENOUS

## 2012-03-10 MED ORDER — SODIUM CHLORIDE 0.9 % IV SOLN
50.0000 mg/m2 | Freq: Once | INTRAVENOUS | Status: AC
  Administered 2012-03-10: 120 mg via INTRAVENOUS
  Filled 2012-03-10: qty 6

## 2012-03-10 MED ORDER — SODIUM CHLORIDE 0.9 % IV SOLN
Freq: Once | INTRAVENOUS | Status: AC
  Administered 2012-03-10: 16:00:00 via INTRAVENOUS

## 2012-03-10 MED ORDER — DEXAMETHASONE SODIUM PHOSPHATE 10 MG/ML IJ SOLN
10.0000 mg | Freq: Once | INTRAMUSCULAR | Status: AC
  Administered 2012-03-10: 10 mg via INTRAVENOUS

## 2012-03-10 MED ORDER — SODIUM CHLORIDE 0.9 % IJ SOLN
10.0000 mL | INTRAMUSCULAR | Status: DC | PRN
  Administered 2012-03-10: 10 mL
  Filled 2012-03-10: qty 10

## 2012-03-10 MED ORDER — HEPARIN SOD (PORK) LOCK FLUSH 100 UNIT/ML IV SOLN
500.0000 [IU] | Freq: Once | INTRAVENOUS | Status: AC | PRN
  Administered 2012-03-10: 500 [IU]
  Filled 2012-03-10: qty 5

## 2012-03-10 NOTE — Progress Notes (Signed)
Call md for problems 

## 2012-03-11 ENCOUNTER — Ambulatory Visit (HOSPITAL_BASED_OUTPATIENT_CLINIC_OR_DEPARTMENT_OTHER): Payer: BC Managed Care – PPO

## 2012-03-11 VITALS — BP 137/81 | HR 67 | Temp 98.0°F

## 2012-03-11 DIAGNOSIS — C8299 Follicular lymphoma, unspecified, extranodal and solid organ sites: Secondary | ICD-10-CM

## 2012-03-11 DIAGNOSIS — I252 Old myocardial infarction: Secondary | ICD-10-CM

## 2012-03-11 DIAGNOSIS — I10 Essential (primary) hypertension: Secondary | ICD-10-CM

## 2012-03-11 DIAGNOSIS — Z951 Presence of aortocoronary bypass graft: Secondary | ICD-10-CM

## 2012-03-11 DIAGNOSIS — C8280 Other types of follicular lymphoma, unspecified site: Secondary | ICD-10-CM

## 2012-03-11 DIAGNOSIS — I639 Cerebral infarction, unspecified: Secondary | ICD-10-CM

## 2012-03-11 DIAGNOSIS — Z5111 Encounter for antineoplastic chemotherapy: Secondary | ICD-10-CM

## 2012-03-11 DIAGNOSIS — E119 Type 2 diabetes mellitus without complications: Secondary | ICD-10-CM

## 2012-03-11 MED ORDER — SODIUM CHLORIDE 0.9 % IV SOLN: Freq: Once | INTRAVENOUS | Status: DC

## 2012-03-11 MED ORDER — SODIUM CHLORIDE 0.9 % IJ SOLN
10.0000 mL | INTRAMUSCULAR | Status: DC | PRN
  Administered 2012-03-11: 10 mL
  Filled 2012-03-11: qty 10

## 2012-03-11 MED ORDER — HEPARIN SOD (PORK) LOCK FLUSH 100 UNIT/ML IV SOLN
500.0000 [IU] | Freq: Once | INTRAVENOUS | Status: AC | PRN
  Administered 2012-03-11: 500 [IU]
  Filled 2012-03-11: qty 5

## 2012-03-11 MED ORDER — ONDANSETRON 8 MG/50ML IVPB (CHCC)
8.0000 mg | Freq: Once | INTRAVENOUS | Status: AC
  Administered 2012-03-11: 8 mg via INTRAVENOUS

## 2012-03-11 MED ORDER — SODIUM CHLORIDE 0.9 % IV SOLN
50.0000 mg/m2 | Freq: Once | INTRAVENOUS | Status: AC
  Administered 2012-03-11: 120 mg via INTRAVENOUS
  Filled 2012-03-11: qty 6

## 2012-03-11 MED ORDER — DEXAMETHASONE SODIUM PHOSPHATE 10 MG/ML IJ SOLN
10.0000 mg | Freq: Once | INTRAMUSCULAR | Status: AC
  Administered 2012-03-11: 10 mg via INTRAVENOUS

## 2012-03-11 NOTE — Patient Instructions (Signed)
[facility] Discharge Instructions for Patients Receiving Chemotherapy  Today you received the following chemotherapy agents Etopiside (VP 16) To help prevent nausea and vomiting after your treatment, we encourage you to take your nausea medication as prescribed. If you develop nausea and vomiting that is not controlled by your nausea medication, call the clinic. If it is after clinic hours your family physician or the after hours number for the clinic or go to the Emergency Department.   BELOW ARE SYMPTOMS THAT SHOULD BE REPORTED IMMEDIATELY:  *FEVER GREATER THAN 100.5 F  *CHILLS WITH OR WITHOUT FEVER  NAUSEA AND VOMITING THAT IS NOT CONTROLLED WITH YOUR NAUSEA MEDICATION  *UNUSUAL SHORTNESS OF BREATH  *UNUSUAL BRUISING OR BLEEDING  TENDERNESS IN MOUTH AND THROAT WITH OR WITHOUT PRESENCE OF ULCERS  *URINARY PROBLEMS  *BOWEL PROBLEMS  UNUSUAL RASH Items with * indicate a potential emergency and should be followed up as soon as possible.  One of the nurses will contact you 24 hours after your treatment. Please let the nurse know about any problems that you may have experienced. Feel free to call the clinic you have any questions or concerns. The clinic phone number is [phone].   I have been informed and understand all the instructions given to me. I know to contact the clinic, my physician, or go to the Emergency Department if any problems should occur. I do not have any questions at this time, but understand that I may call the clinic during office hours   should I have any questions or need assistance in obtaining follow up care.    __________________________________________  _____________  __________ Signature of Patient or Authorized Representative            Date                   Time    __________________________________________ Nurse's Signature

## 2012-03-12 ENCOUNTER — Telehealth: Payer: Self-pay | Admitting: Medical Oncology

## 2012-03-12 NOTE — Telephone Encounter (Signed)
He just woke up after having slept  " the best I have slept in a long time". He feels good without any N/V after chemo yesterday. I encouraged him to call for any concerns and he voices understanding.

## 2012-03-15 ENCOUNTER — Encounter: Payer: Self-pay | Admitting: Oncology

## 2012-03-15 NOTE — Progress Notes (Signed)
I did speak with patient wife this morning to follow up on his disability paper work,she said she would bring in everything this Wednesday.

## 2012-03-17 ENCOUNTER — Encounter: Payer: Self-pay | Admitting: Oncology

## 2012-03-17 ENCOUNTER — Telehealth: Payer: Self-pay | Admitting: Oncology

## 2012-03-17 ENCOUNTER — Telehealth: Payer: Self-pay | Admitting: *Deleted

## 2012-03-17 ENCOUNTER — Ambulatory Visit (HOSPITAL_BASED_OUTPATIENT_CLINIC_OR_DEPARTMENT_OTHER): Payer: BC Managed Care – PPO | Admitting: Oncology

## 2012-03-17 ENCOUNTER — Other Ambulatory Visit (HOSPITAL_BASED_OUTPATIENT_CLINIC_OR_DEPARTMENT_OTHER): Payer: BC Managed Care – PPO | Admitting: Lab

## 2012-03-17 VITALS — BP 156/87 | HR 84 | Temp 98.6°F | Wt 257.8 lb

## 2012-03-17 DIAGNOSIS — C8280 Other types of follicular lymphoma, unspecified site: Secondary | ICD-10-CM

## 2012-03-17 DIAGNOSIS — C8589 Other specified types of non-Hodgkin lymphoma, extranodal and solid organ sites: Secondary | ICD-10-CM

## 2012-03-17 DIAGNOSIS — C8299 Follicular lymphoma, unspecified, extranodal and solid organ sites: Secondary | ICD-10-CM

## 2012-03-17 DIAGNOSIS — E119 Type 2 diabetes mellitus without complications: Secondary | ICD-10-CM

## 2012-03-17 DIAGNOSIS — R51 Headache: Secondary | ICD-10-CM

## 2012-03-17 DIAGNOSIS — I10 Essential (primary) hypertension: Secondary | ICD-10-CM

## 2012-03-17 DIAGNOSIS — I2510 Atherosclerotic heart disease of native coronary artery without angina pectoris: Secondary | ICD-10-CM

## 2012-03-17 DIAGNOSIS — R519 Headache, unspecified: Secondary | ICD-10-CM | POA: Insufficient documentation

## 2012-03-17 LAB — COMPREHENSIVE METABOLIC PANEL
ALT: 44 U/L (ref 0–53)
AST: 20 U/L (ref 0–37)
Albumin: 3.9 g/dL (ref 3.5–5.2)
Alkaline Phosphatase: 64 U/L (ref 39–117)
CO2: 23 mEq/L (ref 19–32)
Chloride: 103 mEq/L (ref 96–112)
Creatinine, Ser: 0.94 mg/dL (ref 0.50–1.35)
Glucose, Bld: 233 mg/dL — ABNORMAL HIGH (ref 70–99)
Sodium: 138 mEq/L (ref 135–145)

## 2012-03-17 LAB — CBC WITH DIFFERENTIAL/PLATELET
Eosinophils Absolute: 0.3 10*3/uL (ref 0.0–0.5)
HCT: 45.2 % (ref 38.4–49.9)
LYMPH%: 11.9 % — ABNORMAL LOW (ref 14.0–49.0)
MCV: 89.4 fL (ref 79.3–98.0)
MONO#: 0.3 10*3/uL (ref 0.1–0.9)
NEUT#: 8.5 10*3/uL — ABNORMAL HIGH (ref 1.5–6.5)
NEUT%: 81.6 % — ABNORMAL HIGH (ref 39.0–75.0)
Platelets: 209 10*3/uL (ref 140–400)
WBC: 10.4 10*3/uL — ABNORMAL HIGH (ref 4.0–10.3)

## 2012-03-17 NOTE — Progress Notes (Signed)
Patient and his wife stop by to see me today,he gave me his disability papers.I told him that I need his E[employer] application and the Medicaid application,he said he forgot but he will get it to me.

## 2012-03-17 NOTE — Telephone Encounter (Signed)
appts made and printed for July,pt aware  That i will call with 6/24 3day tx,email to mw to add

## 2012-03-17 NOTE — Telephone Encounter (Signed)
Per staff message I have scheduled appts. JMW  

## 2012-03-17 NOTE — Progress Notes (Signed)
Hematology and Oncology Follow Up Visit  William Calderon [id number] [birth year] 51 y.o. 03/17/2012 2:18 PM   Principle Diagnosis: Encounter Diagnoses  Name Primary?  . Nodular High Grade B-Cell Lymphoma Yes  . Headache      Interim History:  Short interim followup visit for this 51 year old man who presented with an enlarging right axillary lymph node mass as the first sign of high-grade B-cell non-Hodgkin's lymphoma. Staging evaluation revealed disease localized to the axilla with no additional disease seen on PET scan or on bone marrow biopsies. He was developing constitutional symptoms at time of diagnosis. Clinical and pathologic stage IB. He was started on a program of chemotherapy/immunotherapy with Cytoxan, vincristine, prednisone, and  etoposide chemotherapy substituting the etoposide for Adriamycin in view of his significant underlying cardiac disease and a depressed cardiac ejection fraction of 40%. Rituxan given on day 1. Cytoxan vincristine day 1. Etoposide day 1-3. Prednisone days 1 through 5. We  enlisted the assistance of his family physician to put him on glucose lowering medication. Random glucose already elevated and he had no prior diagnosis or treatment for diabetes. He was started on Glucophage 500 mg twice daily. Overall he tolerated the treatments well except following the first day of treatment he developed a severe diffuse headache. Pressure behind his eyes. Photophobia but no stiff neck. This has lasted for almost a week and is now starting to subside. He was reluctant to take anything even Tylenol without asking. He reports no change in vision. He did not get any nausea or or vomiting with the chemotherapy. Blood counts today at his nadir look good with no fall in his white count or platelets.  Medications: reviewed  Allergies: No Known Allergies  Review of Systems: Constitutional:   No constitutional symptoms Respiratory: He has developed a nonproductive cough  without any fever Cardiovascular:  No chest pain or palpitations Gastrointestinal: No GI symptoms Genito-Urinary: Not questioned Musculoskeletal: No muscle or bone pain Neurologic: See above Skin: No rash Remaining ROS negative.  Physical Exam: Blood pressure 156/87, pulse 84, temperature 98.6 F (37 C), temperature source Oral, weight 257 lb 12.8 oz (116.937 kg). Wt Readings from Last 3 Encounters:  03/17/12 257 lb 12.8 oz (116.937 kg)  03/02/12 260 lb 11.2 oz (118.253 kg)  02/11/12 262 lb 11.2 oz (119.16 kg)     General appearance: Well-nourished Caucasian man [name]: Pharynx no erythema or exudate. Neck full range of motion. Lymph nodes: Complete regression of residual right axillary lymph node mass. No other areas of adenopathy Breasts: Lungs: Clear to auscultation resonant to percussion throughout Heart: Regular rhythm no murmur or gallop Abdomen: Soft nontender no mass no organomegaly Extremities: No edema no calf tenderness Vascular: No cyanosis Neurologic: Mental status intact, cranial nerves intact, pupils round reactive to light, optic disc sharp, no hemorrhage or exudate, motor strength 5 over 5, reflexes absent symmetric at the knees 1+ at the biceps. Upper body coordination normal. Gait normal. Skin: No rash or ecchymosis  Lab Results: Lab Results  Component Value Date   WBC 10.4* 03/17/2012   HGB 15.9 03/17/2012   HCT 45.2 03/17/2012   MCV 89.4 03/17/2012   PLT 209 03/17/2012     Chemistry      Component Value Date/Time   NA 141 02/13/2012 1006   K 3.8 02/13/2012 1006   CL 108 02/13/2012 1006   CO2 24 02/11/2012 1337   BUN 12 02/13/2012 1006   CREATININE 0.80 02/13/2012 1006      Component Value  Date/Time   CALCIUM 9.5 02/11/2012 1337   ALKPHOS 69 02/11/2012 1337   AST 21 02/11/2012 1337   ALT 25 02/11/2012 1337   BILITOT 0.5 02/11/2012 1337      Impression and Plan: #1. Stage Ib, IPI low risk, high-grade, B-cell, non-Hodgkin's lymphoma. Plan: Continue current  treatment program for 4 cycles and and referred for involved field radiation to the right axilla.  #2. Headache. Likely related related to one of the components of his chemotherapy treatment. I looked up Rituxan. Rituxan can cause headache and up to 15% of patients. It has not been associated with pseudotumor cerebri which can be seen with intravenous immunoglobulin products. Another component of his premedication-Zofran-may have been responsible. Headache is subsiding at this point. I told him it is okay for him to use either Tylenol or Advil. I did give him a backup prescription for oxycodone 5 mg if necessary.  #3. Newly diagnosed type 2 diabetes.  #4. Essential hypertension.  #5. Coronary artery disease status post MI, status post coronary bypass surgery  #6. Cerebrovascular disease status post stroke  #7. Hyperlipidemia.  #8. Sleep apnea syndrome  #9. Tobacco addiction  CC:.  Dr. Prudy Feeler; Dr. Manus Rudd   William Feinstein, MD 6/12/20132:18 PM

## 2012-03-18 ENCOUNTER — Telehealth: Payer: Self-pay | Admitting: *Deleted

## 2012-03-18 ENCOUNTER — Encounter: Payer: Self-pay | Admitting: *Deleted

## 2012-03-18 NOTE — Telephone Encounter (Signed)
Talked with pt's wife & reported that Glucose from yest was 233.  Confirmed that they do have a glucose monitor & she reports she picked up strips today.  Pt has script for metformin 500mg  bid with meals but they had the understanding that this was only while on prednisone.  Encouraged to talk with PCP & let her know this lab result & result of fingerstick glucose today & verify how she wants him to take this drug.  Reported that he may need to take daily with most recent lab results.  She will have pt call PCP.

## 2012-03-23 ENCOUNTER — Other Ambulatory Visit: Payer: Self-pay | Admitting: Oncology

## 2012-03-24 ENCOUNTER — Telehealth: Payer: Self-pay | Admitting: Oncology

## 2012-03-24 ENCOUNTER — Encounter (INDEPENDENT_AMBULATORY_CARE_PROVIDER_SITE_OTHER): Payer: Self-pay

## 2012-03-24 NOTE — Telephone Encounter (Signed)
l/m with 6/24 appt info and to get a sch at that time   aom

## 2012-03-25 ENCOUNTER — Other Ambulatory Visit: Payer: Self-pay | Admitting: Oncology

## 2012-03-26 ENCOUNTER — Encounter: Payer: Self-pay | Admitting: Pharmacist

## 2012-03-29 ENCOUNTER — Telehealth: Payer: Self-pay

## 2012-03-29 ENCOUNTER — Other Ambulatory Visit: Payer: BC Managed Care – PPO | Admitting: Lab

## 2012-03-29 ENCOUNTER — Ambulatory Visit: Payer: BC Managed Care – PPO

## 2012-03-29 NOTE — Telephone Encounter (Signed)
Notified by infusion RN that pt did not show up for infusion appt today.  Called pt's home and cell number and left messages for him to call office back.

## 2012-03-30 ENCOUNTER — Telehealth: Payer: Self-pay | Admitting: *Deleted

## 2012-03-30 ENCOUNTER — Other Ambulatory Visit: Payer: Self-pay | Admitting: *Deleted

## 2012-03-30 ENCOUNTER — Ambulatory Visit: Payer: BC Managed Care – PPO

## 2012-03-30 NOTE — Telephone Encounter (Signed)
Message left for pt to call back re: missed appt 03/29/12.  Pt returned call & states that he didn't know he was scheduled & never got a copy of his schedule & didn't realize it had already been 3 wks since last chemo.  Will r/s appt for long day D1 for tomorrow 03/31/12 am.  Pt instructed to come at 9am for lab & chemo & may have to be done in exam room until hopefully an opening is available in the infusion room.  Spoke with pt's wife & appt time given.

## 2012-03-31 ENCOUNTER — Ambulatory Visit: Payer: BC Managed Care – PPO

## 2012-03-31 ENCOUNTER — Other Ambulatory Visit (HOSPITAL_BASED_OUTPATIENT_CLINIC_OR_DEPARTMENT_OTHER): Payer: Medicaid Other | Admitting: Lab

## 2012-03-31 ENCOUNTER — Ambulatory Visit (HOSPITAL_BASED_OUTPATIENT_CLINIC_OR_DEPARTMENT_OTHER): Payer: Medicaid Other

## 2012-03-31 VITALS — BP 123/72 | HR 78 | Temp 97.8°F

## 2012-03-31 DIAGNOSIS — Z5111 Encounter for antineoplastic chemotherapy: Secondary | ICD-10-CM

## 2012-03-31 DIAGNOSIS — C8280 Other types of follicular lymphoma, unspecified site: Secondary | ICD-10-CM

## 2012-03-31 DIAGNOSIS — C8299 Follicular lymphoma, unspecified, extranodal and solid organ sites: Secondary | ICD-10-CM

## 2012-03-31 DIAGNOSIS — I639 Cerebral infarction, unspecified: Secondary | ICD-10-CM

## 2012-03-31 DIAGNOSIS — I2510 Atherosclerotic heart disease of native coronary artery without angina pectoris: Secondary | ICD-10-CM

## 2012-03-31 DIAGNOSIS — E119 Type 2 diabetes mellitus without complications: Secondary | ICD-10-CM

## 2012-03-31 DIAGNOSIS — Z951 Presence of aortocoronary bypass graft: Secondary | ICD-10-CM

## 2012-03-31 DIAGNOSIS — I10 Essential (primary) hypertension: Secondary | ICD-10-CM

## 2012-03-31 DIAGNOSIS — I252 Old myocardial infarction: Secondary | ICD-10-CM

## 2012-03-31 LAB — CBC WITH DIFFERENTIAL/PLATELET
BASO%: 1.2 % (ref 0.0–2.0)
EOS%: 2.7 % (ref 0.0–7.0)
HCT: 45.5 % (ref 38.4–49.9)
MCH: 30.9 pg (ref 27.2–33.4)
MCHC: 36 g/dL (ref 32.0–36.0)
MONO#: 0.7 10*3/uL (ref 0.1–0.9)
RBC: 5.31 10*6/uL (ref 4.20–5.82)
RDW: 12.8 % (ref 11.0–14.6)
WBC: 8.3 10*3/uL (ref 4.0–10.3)
lymph#: 2.1 10*3/uL (ref 0.9–3.3)

## 2012-03-31 LAB — TECHNOLOGIST REVIEW

## 2012-03-31 MED ORDER — SODIUM CHLORIDE 0.9 % IJ SOLN
10.0000 mL | INTRAMUSCULAR | Status: DC | PRN
  Administered 2012-03-31: 10 mL
  Filled 2012-03-31: qty 10

## 2012-03-31 MED ORDER — ACETAMINOPHEN 325 MG PO TABS: 650.0000 mg | ORAL_TABLET | Freq: Once | ORAL | Status: DC

## 2012-03-31 MED ORDER — VINCRISTINE SULFATE CHEMO INJECTION 1 MG/ML
2.0000 mg | Freq: Once | INTRAVENOUS | Status: AC
  Administered 2012-03-31: 2 mg via INTRAVENOUS
  Filled 2012-03-31: qty 2

## 2012-03-31 MED ORDER — SODIUM CHLORIDE 0.9 % IV SOLN
375.0000 mg/m2 | Freq: Once | INTRAVENOUS | Status: AC
  Administered 2012-03-31: 900 mg via INTRAVENOUS
  Filled 2012-03-31: qty 90

## 2012-03-31 MED ORDER — SODIUM CHLORIDE 0.9 % IV SOLN
750.0000 mg/m2 | Freq: Once | INTRAVENOUS | Status: AC
  Administered 2012-03-31: 1780 mg via INTRAVENOUS
  Filled 2012-03-31: qty 89

## 2012-03-31 MED ORDER — DEXAMETHASONE SODIUM PHOSPHATE 4 MG/ML IJ SOLN
20.0000 mg | Freq: Once | INTRAMUSCULAR | Status: AC
  Administered 2012-03-31: 20 mg via INTRAVENOUS

## 2012-03-31 MED ORDER — HEPARIN SOD (PORK) LOCK FLUSH 100 UNIT/ML IV SOLN
500.0000 [IU] | Freq: Once | INTRAVENOUS | Status: AC | PRN
  Administered 2012-03-31: 500 [IU]
  Filled 2012-03-31: qty 5

## 2012-03-31 MED ORDER — PALONOSETRON HCL INJECTION 0.25 MG/5ML
0.2500 mg | Freq: Once | INTRAVENOUS | Status: AC
  Administered 2012-03-31: 0.25 mg via INTRAVENOUS

## 2012-03-31 MED ORDER — DIPHENHYDRAMINE HCL 25 MG PO CAPS: 50.0000 mg | ORAL_CAPSULE | Freq: Once | ORAL | Status: DC

## 2012-03-31 MED ORDER — SODIUM CHLORIDE 0.9 % IV SOLN
50.0000 mg/m2 | Freq: Once | INTRAVENOUS | Status: AC
  Administered 2012-03-31: 120 mg via INTRAVENOUS
  Filled 2012-03-31: qty 6

## 2012-03-31 MED ORDER — DIPHENHYDRAMINE HCL 50 MG/ML IJ SOLN
25.0000 mg | Freq: Once | INTRAMUSCULAR | Status: AC
  Administered 2012-03-31: 25 mg via INTRAVENOUS

## 2012-03-31 MED ORDER — SODIUM CHLORIDE 0.9 % IV SOLN
Freq: Once | INTRAVENOUS | Status: AC
  Administered 2012-03-31: 10:00:00 via INTRAVENOUS

## 2012-03-31 NOTE — Patient Instructions (Signed)
[facility] Discharge Instructions for Patients Receiving Chemotherapy  Today you received the following chemotherapy agents Vincristin, Cytoxan, Rituxan, VP-16 To help prevent nausea and vomiting after your treatment, we encourage you to take your nausea medication Begin taking it at 7pm and take it as often as prescribed for the next 24-72 hours.   If you develop nausea and vomiting that is not controlled by your nausea medication, call the clinic. If it is after clinic hours your family physician or the after hours number for the clinic or go to the Emergency Department.   BELOW ARE SYMPTOMS THAT SHOULD BE REPORTED IMMEDIATELY:  *FEVER GREATER THAN 100.5 F  *CHILLS WITH OR WITHOUT FEVER  NAUSEA AND VOMITING THAT IS NOT CONTROLLED WITH YOUR NAUSEA MEDICATION  *UNUSUAL SHORTNESS OF BREATH  *UNUSUAL BRUISING OR BLEEDING  TENDERNESS IN MOUTH AND THROAT WITH OR WITHOUT PRESENCE OF ULCERS  *URINARY PROBLEMS  *BOWEL PROBLEMS  UNUSUAL RASH Items with * indicate a potential emergency and should be followed up as soon as possible.  One of the nurses will contact you 24 hours after your treatment. Please let the nurse know about any problems that you may have experienced. Feel free to call the clinic you have any questions or concerns. The clinic phone number is [phone].   I have been informed and understand all the instructions given to me. I know to contact the clinic, my physician, or go to the Emergency Department if any problems should occur. I do not have any questions at this time, but understand that I may call the clinic during office hours   should I have any questions or need assistance in obtaining follow up care.    __________________________________________  _____________  __________ Signature of Patient or Authorized Representative            Date                   Time    __________________________________________ Nurse's Signature

## 2012-03-31 NOTE — Progress Notes (Signed)
Excellent blood return maintained throughout Vincristine administration.

## 2012-04-01 ENCOUNTER — Ambulatory Visit (HOSPITAL_BASED_OUTPATIENT_CLINIC_OR_DEPARTMENT_OTHER): Payer: Medicaid Other

## 2012-04-01 VITALS — BP 132/78 | HR 77 | Temp 98.2°F

## 2012-04-01 DIAGNOSIS — I252 Old myocardial infarction: Secondary | ICD-10-CM

## 2012-04-01 DIAGNOSIS — I10 Essential (primary) hypertension: Secondary | ICD-10-CM

## 2012-04-01 DIAGNOSIS — Z5111 Encounter for antineoplastic chemotherapy: Secondary | ICD-10-CM

## 2012-04-01 DIAGNOSIS — I639 Cerebral infarction, unspecified: Secondary | ICD-10-CM

## 2012-04-01 DIAGNOSIS — C8280 Other types of follicular lymphoma, unspecified site: Secondary | ICD-10-CM

## 2012-04-01 DIAGNOSIS — C8299 Follicular lymphoma, unspecified, extranodal and solid organ sites: Secondary | ICD-10-CM

## 2012-04-01 DIAGNOSIS — E119 Type 2 diabetes mellitus without complications: Secondary | ICD-10-CM

## 2012-04-01 DIAGNOSIS — Z951 Presence of aortocoronary bypass graft: Secondary | ICD-10-CM

## 2012-04-01 MED ORDER — SODIUM CHLORIDE 0.9 % IV SOLN
Freq: Once | INTRAVENOUS | Status: AC
  Administered 2012-04-01: 14:00:00 via INTRAVENOUS

## 2012-04-01 MED ORDER — HEPARIN SOD (PORK) LOCK FLUSH 100 UNIT/ML IV SOLN
500.0000 [IU] | Freq: Once | INTRAVENOUS | Status: AC | PRN
  Administered 2012-04-01: 500 [IU]
  Filled 2012-04-01: qty 5

## 2012-04-01 MED ORDER — SODIUM CHLORIDE 0.9 % IJ SOLN
10.0000 mL | INTRAMUSCULAR | Status: DC | PRN
  Administered 2012-04-01: 10 mL
  Filled 2012-04-01: qty 10

## 2012-04-01 MED ORDER — DEXAMETHASONE SODIUM PHOSPHATE 10 MG/ML IJ SOLN
10.0000 mg | Freq: Once | INTRAMUSCULAR | Status: AC
  Administered 2012-04-01: 10 mg via INTRAVENOUS

## 2012-04-01 MED ORDER — DIPHENHYDRAMINE HCL 50 MG/ML IJ SOLN
25.0000 mg | Freq: Once | INTRAMUSCULAR | Status: AC
  Administered 2012-04-01: 25 mg via INTRAVENOUS

## 2012-04-01 MED ORDER — SODIUM CHLORIDE 0.9 % IV SOLN
50.0000 mg/m2 | Freq: Once | INTRAVENOUS | Status: AC
  Administered 2012-04-01: 120 mg via INTRAVENOUS
  Filled 2012-04-01: qty 6

## 2012-04-01 NOTE — Patient Instructions (Signed)
[facility] Discharge Instructions for Patients Receiving Chemotherapy  Today you received the following chemotherapy agents VP 16 (Etoposide) To help prevent nausea and vomiting after your treatment, we encourage you to take your nausea medication as prescribed.  If you develop nausea and vomiting that is not controlled by your nausea medication, call the clinic. If it is after clinic hours your family physician or the after hours number for the clinic or go to the Emergency Department.   BELOW ARE SYMPTOMS THAT SHOULD BE REPORTED IMMEDIATELY:  *FEVER GREATER THAN 100.5 F  *CHILLS WITH OR WITHOUT FEVER  NAUSEA AND VOMITING THAT IS NOT CONTROLLED WITH YOUR NAUSEA MEDICATION  *UNUSUAL SHORTNESS OF BREATH  *UNUSUAL BRUISING OR BLEEDING  TENDERNESS IN MOUTH AND THROAT WITH OR WITHOUT PRESENCE OF ULCERS  *URINARY PROBLEMS  *BOWEL PROBLEMS  UNUSUAL RASH Items with * indicate a potential emergency and should be followed up as soon as possible.  One of the nurses will contact you 24 hours after your treatment. Please let the nurse know about any problems that you may have experienced. Feel free to call the clinic you have any questions or concerns. The clinic phone number is [phone].   I have been informed and understand all the instructions given to me. I know to contact the clinic, my physician, or go to the Emergency Department if any problems should occur. I do not have any questions at this time, but understand that I may call the clinic during office hours   should I have any questions or need assistance in obtaining follow up care.    __________________________________________  _____________  __________ Signature of Patient or Authorized Representative            Date                   Time    __________________________________________ Nurse's Signature    

## 2012-04-02 ENCOUNTER — Telehealth: Payer: Self-pay | Admitting: Oncology

## 2012-04-02 ENCOUNTER — Ambulatory Visit (HOSPITAL_BASED_OUTPATIENT_CLINIC_OR_DEPARTMENT_OTHER): Payer: Medicaid Other

## 2012-04-02 VITALS — BP 165/94 | HR 88 | Temp 97.8°F

## 2012-04-02 DIAGNOSIS — I10 Essential (primary) hypertension: Secondary | ICD-10-CM

## 2012-04-02 DIAGNOSIS — I252 Old myocardial infarction: Secondary | ICD-10-CM

## 2012-04-02 DIAGNOSIS — Z5111 Encounter for antineoplastic chemotherapy: Secondary | ICD-10-CM

## 2012-04-02 DIAGNOSIS — Z951 Presence of aortocoronary bypass graft: Secondary | ICD-10-CM

## 2012-04-02 DIAGNOSIS — I639 Cerebral infarction, unspecified: Secondary | ICD-10-CM

## 2012-04-02 DIAGNOSIS — C8299 Follicular lymphoma, unspecified, extranodal and solid organ sites: Secondary | ICD-10-CM

## 2012-04-02 DIAGNOSIS — C8280 Other types of follicular lymphoma, unspecified site: Secondary | ICD-10-CM

## 2012-04-02 DIAGNOSIS — E119 Type 2 diabetes mellitus without complications: Secondary | ICD-10-CM

## 2012-04-02 MED ORDER — DIPHENHYDRAMINE HCL 50 MG/ML IJ SOLN
25.0000 mg | Freq: Once | INTRAMUSCULAR | Status: AC
  Administered 2012-04-02: 25 mg via INTRAVENOUS

## 2012-04-02 MED ORDER — SODIUM CHLORIDE 0.9 % IJ SOLN
10.0000 mL | INTRAMUSCULAR | Status: DC | PRN
  Administered 2012-04-02: 10 mL
  Filled 2012-04-02: qty 10

## 2012-04-02 MED ORDER — DEXAMETHASONE SODIUM PHOSPHATE 10 MG/ML IJ SOLN
10.0000 mg | Freq: Once | INTRAMUSCULAR | Status: AC
  Administered 2012-04-02: 10 mg via INTRAVENOUS

## 2012-04-02 MED ORDER — HEPARIN SOD (PORK) LOCK FLUSH 100 UNIT/ML IV SOLN
500.0000 [IU] | Freq: Once | INTRAVENOUS | Status: AC | PRN
  Administered 2012-04-02: 500 [IU]
  Filled 2012-04-02: qty 5

## 2012-04-02 MED ORDER — SODIUM CHLORIDE 0.9 % IV SOLN
Freq: Once | INTRAVENOUS | Status: AC
  Administered 2012-04-02: 12:00:00 via INTRAVENOUS

## 2012-04-02 MED ORDER — SODIUM CHLORIDE 0.9 % IV SOLN
50.0000 mg/m2 | Freq: Once | INTRAVENOUS | Status: AC
  Administered 2012-04-02: 120 mg via INTRAVENOUS
  Filled 2012-04-02: qty 6

## 2012-04-02 NOTE — Telephone Encounter (Signed)
Talked to patient's wife and gave her appt moved from 7/29 to 7/30 due to MD PAL

## 2012-04-02 NOTE — Patient Instructions (Signed)
[facility] Discharge Instructions for Patients Receiving Chemotherapy  Today you received the following chemotherapy agents Etoposide  To help prevent nausea and vomiting after your treatment, we encourage you to take your nausea medication as prescribed.   If you develop nausea and vomiting that is not controlled by your nausea medication, call the clinic. If it is after clinic hours your family physician or the after hours number for the clinic or go to the Emergency Department.   BELOW ARE SYMPTOMS THAT SHOULD BE REPORTED IMMEDIATELY:  *FEVER GREATER THAN 100.5 F  *CHILLS WITH OR WITHOUT FEVER  NAUSEA AND VOMITING THAT IS NOT CONTROLLED WITH YOUR NAUSEA MEDICATION  *UNUSUAL SHORTNESS OF BREATH  *UNUSUAL BRUISING OR BLEEDING  TENDERNESS IN MOUTH AND THROAT WITH OR WITHOUT PRESENCE OF ULCERS  *URINARY PROBLEMS  *BOWEL PROBLEMS  UNUSUAL RASH Items with * indicate a potential emergency and should be followed up as soon as possible.  One of the nurses will contact you 24 hours after your treatment. Please let the nurse know about any problems that you may have experienced. Feel free to call the clinic you have any questions or concerns. The clinic phone number is [phone].   I have been informed and understand all the instructions given to me. I know to contact the clinic, my physician, or go to the Emergency Department if any problems should occur. I do not have any questions at this time, but understand that I may call the clinic during office hours   should I have any questions or need assistance in obtaining follow up care.    __________________________________________  _____________  __________ Signature of Patient or Authorized Representative            Date                   Time    __________________________________________ Nurse's Signature    

## 2012-04-06 ENCOUNTER — Other Ambulatory Visit (HOSPITAL_BASED_OUTPATIENT_CLINIC_OR_DEPARTMENT_OTHER): Payer: Medicaid Other | Admitting: Lab

## 2012-04-06 ENCOUNTER — Ambulatory Visit (HOSPITAL_BASED_OUTPATIENT_CLINIC_OR_DEPARTMENT_OTHER): Payer: Medicaid Other | Admitting: Nurse Practitioner

## 2012-04-06 ENCOUNTER — Ambulatory Visit: Payer: BC Managed Care – PPO | Admitting: Oncology

## 2012-04-06 VITALS — BP 148/91 | HR 75 | Temp 97.5°F | Ht 67.0 in | Wt 256.9 lb

## 2012-04-06 DIAGNOSIS — C8299 Follicular lymphoma, unspecified, extranodal and solid organ sites: Secondary | ICD-10-CM

## 2012-04-06 DIAGNOSIS — I2510 Atherosclerotic heart disease of native coronary artery without angina pectoris: Secondary | ICD-10-CM

## 2012-04-06 DIAGNOSIS — E119 Type 2 diabetes mellitus without complications: Secondary | ICD-10-CM

## 2012-04-06 DIAGNOSIS — C8280 Other types of follicular lymphoma, unspecified site: Secondary | ICD-10-CM

## 2012-04-06 DIAGNOSIS — B370 Candidal stomatitis: Secondary | ICD-10-CM

## 2012-04-06 LAB — CBC WITH DIFFERENTIAL/PLATELET
Basophils Absolute: 0 10*3/uL (ref 0.0–0.1)
Eosinophils Absolute: 0 10*3/uL (ref 0.0–0.5)
HCT: 45.3 % (ref 38.4–49.9)
LYMPH%: 15.5 % (ref 14.0–49.0)
MCV: 89 fL (ref 79.3–98.0)
MONO#: 0.2 10*3/uL (ref 0.1–0.9)
MONO%: 1.1 % (ref 0.0–14.0)
NEUT#: 11.4 10*3/uL — ABNORMAL HIGH (ref 1.5–6.5)
NEUT%: 82.8 % — ABNORMAL HIGH (ref 39.0–75.0)
Platelets: 228 10*3/uL (ref 140–400)
WBC: 13.8 10*3/uL — ABNORMAL HIGH (ref 4.0–10.3)

## 2012-04-06 LAB — COMPREHENSIVE METABOLIC PANEL
BUN: 15 mg/dL (ref 6–23)
CO2: 23 mEq/L (ref 19–32)
Creatinine, Ser: 0.86 mg/dL (ref 0.50–1.35)
Glucose, Bld: 143 mg/dL — ABNORMAL HIGH (ref 70–99)
Total Bilirubin: 0.5 mg/dL (ref 0.3–1.2)
Total Protein: 6.2 g/dL (ref 6.0–8.3)

## 2012-04-06 MED ORDER — FLUCONAZOLE 100 MG PO TABS: 100.0000 mg | ORAL_TABLET | Freq: Every day | ORAL | Status: AC

## 2012-04-06 NOTE — Progress Notes (Signed)
OFFICE PROGRESS NOTE  Interval history:  William Calderon is a 51 year old man recently diagnosed with high-grade B-cell non-Hodgkin's lymphoma involving a right axillary lymph node mass. Staging evaluation showed disease localized to the axilla with no additional disease seen on PET scan or bone marrow biopsy. He was developing constitutional symptoms at the time of diagnosis. He began treatment with Cytoxan/vincristine/etoposide/Rituxan and prednisone on 03/09/2012. Etoposide was substituted for Adriamycin due to significant underlying cardiac disease and a decreased cardiac ejection fraction of 40%. The etoposide is given days 1-3. He completed cycle 2 beginning 03/31/2012. He is seen today for scheduled followup.  William Calderon reports he tolerated cycle 2 much better than cycle 1. He did not experience the severe headaches. No nausea, vomiting or diarrhea. No constipation. He denies mouth sores. No numbness or tingling in his hands or feet. He has a good appetite.   Objective: Blood pressure 148/91, pulse 75, temperature 97.5 F (36.4 C), temperature source Oral, height 5\' 7"  (1.702 m), weight 256 lb 14.4 oz (116.529 kg).  White patches scattered over bilateral buccal regions. No palpable cervical, supraclavicular or axillary lymph nodes. Well-healed right axillary incision. No erythema. Lungs are clear. Regular cardiac rhythm. Abdomen soft and nontender. No organomegaly. Extremities are without edema. Motor strength 5 over 5. Knee DTRs absent symmetrically. Vibratory sense is mildly decreased over the fingertips on the left hand, normal on the right hand.  Lab Results: Lab Results  Component Value Date   WBC 13.8* 04/06/2012   HGB 15.7 04/06/2012   HCT 45.3 04/06/2012   MCV 89.0 04/06/2012   PLT 228 04/06/2012    Chemistry:    Chemistry      Component Value Date/Time   NA 138 03/17/2012 1108   K 3.7 03/17/2012 1108   CL 103 03/17/2012 1108   CO2 23 03/17/2012 1108   BUN 10 03/17/2012 1108   CREATININE 0.94 03/17/2012 1108      Component Value Date/Time   CALCIUM 9.1 03/17/2012 1108   ALKPHOS 64 03/17/2012 1108   AST 20 03/17/2012 1108   ALT 44 03/17/2012 1108   BILITOT 0.7 03/17/2012 1108       Studies/Results: No results found.  Medications: I have reviewed the patient's current medications.  Assessment/Plan: 1. High-grade B-cell non-Hodgkin's lymphoma involving a right axillary mass status post 2 cycles of Cytoxan/vincristine/etoposide/prednisone and Rituxan. 2. Oral candidiasis. He will complete a course of Diflucan. 3. Headaches following cycle 1. Significantly improved. 4. Recently diagnosed type 2 diabetes. He continues metformin. 5. Hypertension. 6. Coronary artery disease status post MI, status post coronary bypass surgery. 7. Cerebrovascular disease status post stroke. 8. Hyperlipidemia. 9. Sleep apnea.  10. Tobacco use.  Disposition-William Calderon appears stable. He has completed 2 cycles of systemic therapy. He is scheduled to return for cycle 3 04/19/2012. He has a followup visit with Dr. Cyndie Chime on 05/04/2012. He will contact the office in the interim with any problems.    Lonna Cobb ANP/GNP-BC

## 2012-04-09 ENCOUNTER — Telehealth: Payer: Self-pay | Admitting: *Deleted

## 2012-04-09 ENCOUNTER — Other Ambulatory Visit: Payer: Self-pay | Admitting: *Deleted

## 2012-04-09 DIAGNOSIS — C8590 Non-Hodgkin lymphoma, unspecified, unspecified site: Secondary | ICD-10-CM

## 2012-04-09 MED ORDER — OXYCODONE HCL 5 MG PO TABS: ORAL_TABLET | ORAL | Status: DC

## 2012-04-09 MED ORDER — AZITHROMYCIN 1 G PO PACK: 1.0000 | PACK | Freq: Once | ORAL | Status: AC

## 2012-04-09 NOTE — Telephone Encounter (Signed)
Per staff message I have scheduled appts. JMW  

## 2012-04-09 NOTE — Telephone Encounter (Signed)
Sent William Calderon email to set up patient's treatment for 05-10-2012 05-11-2012 and 05-12-2012 left patient voice message informing the patient to please stop by scheduling on the 04-19-2012 to get a revised calendar

## 2012-04-12 ENCOUNTER — Encounter: Payer: Self-pay | Admitting: Dietician

## 2012-04-12 NOTE — Progress Notes (Signed)
Brief Out-patient Oncology Nutrition Note  Reason: Patient screened positive for nutrition risk for unintentional weight loss and decreased appetite.   William Calderon is a 51 year old male patient of Dr. Cyndie Chime, diagnosed with lymphoma. He reported his appetite and intake are well. He does not drink any nutrition supplements and reported his weight is staying stable. He reported he is on steroid medication which causes his intake to be very good. He denies any problems with nausea.   Wt Readings from Last 10 Encounters:  04/06/12 256 lb 14.4 oz (116.529 kg)  03/17/12 257 lb 12.8 oz (116.937 kg)  03/02/12 260 lb 11.2 oz (118.253 kg)  02/11/12 262 lb 11.2 oz (119.16 kg)  02/02/12 263 lb (119.296 kg)  01/22/12 263 lb 12.8 oz (119.659 kg)  01/15/12 260 lb (117.935 kg)  01/14/12 263 lb (119.296 kg)  12/12/11 262 lb (118.842 kg)  12/09/11 262 lb (118.842 kg)   I encouraged the patient to make sure to eat high calorie, high protein foods if weight begins to decline. Patient was without any nutrition related questions.   RD available for nutrition needs.   Iven Finn [facility] [phone]

## 2012-04-14 ENCOUNTER — Other Ambulatory Visit: Payer: Self-pay | Admitting: Oncology

## 2012-04-15 ENCOUNTER — Other Ambulatory Visit: Payer: Self-pay | Admitting: *Deleted

## 2012-04-15 ENCOUNTER — Telehealth: Payer: Self-pay | Admitting: *Deleted

## 2012-04-15 NOTE — Telephone Encounter (Signed)
Pharmacy called about order sent in on July 5th for azithromycin powder.  Their supplier says this is not available.  Would like to change to zithromax 500 mg, two tab taken once.  Asked that we call any orders to [phone].

## 2012-04-19 ENCOUNTER — Ambulatory Visit (HOSPITAL_BASED_OUTPATIENT_CLINIC_OR_DEPARTMENT_OTHER): Payer: Medicaid Other

## 2012-04-19 ENCOUNTER — Other Ambulatory Visit (HOSPITAL_BASED_OUTPATIENT_CLINIC_OR_DEPARTMENT_OTHER): Payer: Medicaid Other

## 2012-04-19 VITALS — BP 141/83 | HR 85 | Temp 98.2°F

## 2012-04-19 DIAGNOSIS — C8280 Other types of follicular lymphoma, unspecified site: Secondary | ICD-10-CM

## 2012-04-19 DIAGNOSIS — E119 Type 2 diabetes mellitus without complications: Secondary | ICD-10-CM

## 2012-04-19 DIAGNOSIS — I252 Old myocardial infarction: Secondary | ICD-10-CM

## 2012-04-19 DIAGNOSIS — C8299 Follicular lymphoma, unspecified, extranodal and solid organ sites: Secondary | ICD-10-CM

## 2012-04-19 DIAGNOSIS — Z5111 Encounter for antineoplastic chemotherapy: Secondary | ICD-10-CM

## 2012-04-19 DIAGNOSIS — Z951 Presence of aortocoronary bypass graft: Secondary | ICD-10-CM

## 2012-04-19 DIAGNOSIS — I639 Cerebral infarction, unspecified: Secondary | ICD-10-CM

## 2012-04-19 DIAGNOSIS — I10 Essential (primary) hypertension: Secondary | ICD-10-CM

## 2012-04-19 DIAGNOSIS — Z5112 Encounter for antineoplastic immunotherapy: Secondary | ICD-10-CM

## 2012-04-19 LAB — COMPREHENSIVE METABOLIC PANEL
AST: 17 U/L (ref 0–37)
Albumin: 4.3 g/dL (ref 3.5–5.2)
Alkaline Phosphatase: 61 U/L (ref 39–117)
BUN: 7 mg/dL (ref 6–23)
Glucose, Bld: 178 mg/dL — ABNORMAL HIGH (ref 70–99)
Potassium: 3.5 mEq/L (ref 3.5–5.3)
Sodium: 141 mEq/L (ref 135–145)
Total Bilirubin: 0.6 mg/dL (ref 0.3–1.2)

## 2012-04-19 LAB — CBC WITH DIFFERENTIAL/PLATELET
BASO%: 1.3 % (ref 0.0–2.0)
EOS%: 4.8 % (ref 0.0–7.0)
HGB: 15.7 g/dL (ref 13.0–17.1)
MCH: 30.4 pg (ref 27.2–33.4)
MCHC: 35.4 g/dL (ref 32.0–36.0)
MCV: 85.9 fL (ref 79.3–98.0)
MONO%: 13.4 % (ref 0.0–14.0)
NEUT#: 4.3 10*3/uL (ref 1.5–6.5)
NEUT%: 62.1 % (ref 39.0–75.0)
RDW: 13 % (ref 11.0–14.6)
WBC: 6.9 10*3/uL (ref 4.0–10.3)
lymph#: 1.3 10*3/uL (ref 0.9–3.3)

## 2012-04-19 LAB — URIC ACID: Uric Acid, Serum: 7.3 mg/dL (ref 4.0–7.8)

## 2012-04-19 MED ORDER — SODIUM CHLORIDE 0.9 % IV SOLN
375.0000 mg/m2 | Freq: Once | INTRAVENOUS | Status: AC
  Administered 2012-04-19: 900 mg via INTRAVENOUS
  Filled 2012-04-19: qty 90

## 2012-04-19 MED ORDER — VINCRISTINE SULFATE CHEMO INJECTION 1 MG/ML
2.0000 mg | Freq: Once | INTRAVENOUS | Status: AC
  Administered 2012-04-19: 2 mg via INTRAVENOUS
  Filled 2012-04-19: qty 2

## 2012-04-19 MED ORDER — DIPHENHYDRAMINE HCL 50 MG/ML IJ SOLN
25.0000 mg | Freq: Once | INTRAMUSCULAR | Status: AC
  Administered 2012-04-19: 25 mg via INTRAVENOUS

## 2012-04-19 MED ORDER — PALONOSETRON HCL INJECTION 0.25 MG/5ML
0.2500 mg | Freq: Once | INTRAVENOUS | Status: AC
  Administered 2012-04-19: 0.25 mg via INTRAVENOUS

## 2012-04-19 MED ORDER — SODIUM CHLORIDE 0.9 % IV SOLN
Freq: Once | INTRAVENOUS | Status: AC
  Administered 2012-04-19: 09:00:00 via INTRAVENOUS

## 2012-04-19 MED ORDER — DIPHENHYDRAMINE HCL 25 MG PO CAPS
50.0000 mg | ORAL_CAPSULE | Freq: Once | ORAL | Status: AC
  Administered 2012-04-19: 50 mg via ORAL

## 2012-04-19 MED ORDER — DEXAMETHASONE SODIUM PHOSPHATE 4 MG/ML IJ SOLN
20.0000 mg | Freq: Once | INTRAMUSCULAR | Status: AC
  Administered 2012-04-19: 20 mg via INTRAVENOUS

## 2012-04-19 MED ORDER — SODIUM CHLORIDE 0.9 % IV SOLN
750.0000 mg/m2 | Freq: Once | INTRAVENOUS | Status: AC
  Administered 2012-04-19: 1780 mg via INTRAVENOUS
  Filled 2012-04-19: qty 89

## 2012-04-19 MED ORDER — SODIUM CHLORIDE 0.9 % IJ SOLN
10.0000 mL | INTRAMUSCULAR | Status: DC | PRN
  Administered 2012-04-19: 10 mL
  Filled 2012-04-19: qty 10

## 2012-04-19 MED ORDER — SODIUM CHLORIDE 0.9 % IV SOLN
50.0000 mg/m2 | Freq: Once | INTRAVENOUS | Status: AC
  Administered 2012-04-19: 120 mg via INTRAVENOUS
  Filled 2012-04-19: qty 6

## 2012-04-19 MED ORDER — ACETAMINOPHEN 325 MG PO TABS
650.0000 mg | ORAL_TABLET | Freq: Once | ORAL | Status: AC
  Administered 2012-04-19: 650 mg via ORAL

## 2012-04-19 MED ORDER — HEPARIN SOD (PORK) LOCK FLUSH 100 UNIT/ML IV SOLN
500.0000 [IU] | Freq: Once | INTRAVENOUS | Status: AC | PRN
  Administered 2012-04-19: 500 [IU]
  Filled 2012-04-19: qty 5

## 2012-04-19 NOTE — Patient Instructions (Addendum)
[facility] Discharge Instructions for Patients Receiving Chemotherapy  Today you received the following chemotherapy agents Rituxan, Cytoxan, Vincristine and Etoposide  To help prevent nausea and vomiting after your treatment, we encourage you to take your nausea medication Begin taking it at 7 pm and take it as often as prescribed for the next 24 to 72 hours.   If you develop nausea and vomiting that is not controlled by your nausea medication, call the clinic. If it is after clinic hours your family physician or the after hours number for the clinic or go to the Emergency Department.   BELOW ARE SYMPTOMS THAT SHOULD BE REPORTED IMMEDIATELY:  *FEVER GREATER THAN 100.5 F  *CHILLS WITH OR WITHOUT FEVER  NAUSEA AND VOMITING THAT IS NOT CONTROLLED WITH YOUR NAUSEA MEDICATION  *UNUSUAL SHORTNESS OF BREATH  *UNUSUAL BRUISING OR BLEEDING  TENDERNESS IN MOUTH AND THROAT WITH OR WITHOUT PRESENCE OF ULCERS  *URINARY PROBLEMS  *BOWEL PROBLEMS  UNUSUAL RASH Items with * indicate a potential emergency and should be followed up as soon as possible.  One of the nurses will contact you 24 hours after your treatment. Please let the nurse know about any problems that you may have experienced. Feel free to call the clinic you have any questions or concerns. The clinic phone number is [phone].   I have been informed and understand all the instructions given to me. I know to contact the clinic, my physician, or go to the Emergency Department if any problems should occur. I do not have any questions at this time, but understand that I may call the clinic during office hours   should I have any questions or need assistance in obtaining follow up care.    __________________________________________  _____________  __________ Signature of Patient or Authorized Representative            Date                   Time    __________________________________________ Nurse's  Signature

## 2012-04-20 ENCOUNTER — Ambulatory Visit (HOSPITAL_BASED_OUTPATIENT_CLINIC_OR_DEPARTMENT_OTHER): Payer: Medicaid Other

## 2012-04-20 VITALS — BP 144/82 | HR 94 | Temp 98.2°F

## 2012-04-20 DIAGNOSIS — I639 Cerebral infarction, unspecified: Secondary | ICD-10-CM

## 2012-04-20 DIAGNOSIS — C8280 Other types of follicular lymphoma, unspecified site: Secondary | ICD-10-CM

## 2012-04-20 DIAGNOSIS — E119 Type 2 diabetes mellitus without complications: Secondary | ICD-10-CM

## 2012-04-20 DIAGNOSIS — Z951 Presence of aortocoronary bypass graft: Secondary | ICD-10-CM

## 2012-04-20 DIAGNOSIS — I10 Essential (primary) hypertension: Secondary | ICD-10-CM

## 2012-04-20 DIAGNOSIS — I252 Old myocardial infarction: Secondary | ICD-10-CM

## 2012-04-20 DIAGNOSIS — C8299 Follicular lymphoma, unspecified, extranodal and solid organ sites: Secondary | ICD-10-CM

## 2012-04-20 DIAGNOSIS — Z5111 Encounter for antineoplastic chemotherapy: Secondary | ICD-10-CM

## 2012-04-20 MED ORDER — SODIUM CHLORIDE 0.9 % IV SOLN
50.0000 mg/m2 | Freq: Once | INTRAVENOUS | Status: AC
  Administered 2012-04-20: 120 mg via INTRAVENOUS
  Filled 2012-04-20: qty 6

## 2012-04-20 MED ORDER — SODIUM CHLORIDE 0.9 % IJ SOLN
10.0000 mL | INTRAMUSCULAR | Status: DC | PRN
  Administered 2012-04-20: 10 mL
  Filled 2012-04-20: qty 10

## 2012-04-20 MED ORDER — DEXAMETHASONE SODIUM PHOSPHATE 10 MG/ML IJ SOLN
10.0000 mg | Freq: Once | INTRAMUSCULAR | Status: AC
  Administered 2012-04-20: 10 mg via INTRAVENOUS

## 2012-04-20 MED ORDER — DIPHENHYDRAMINE HCL 50 MG/ML IJ SOLN
25.0000 mg | Freq: Once | INTRAMUSCULAR | Status: AC
  Administered 2012-04-20: 25 mg via INTRAVENOUS

## 2012-04-20 MED ORDER — HEPARIN SOD (PORK) LOCK FLUSH 100 UNIT/ML IV SOLN
500.0000 [IU] | Freq: Once | INTRAVENOUS | Status: AC | PRN
  Administered 2012-04-20: 500 [IU]
  Filled 2012-04-20: qty 5

## 2012-04-20 MED ORDER — SODIUM CHLORIDE 0.9 % IV SOLN
Freq: Once | INTRAVENOUS | Status: AC
  Administered 2012-04-20: 15:00:00 via INTRAVENOUS

## 2012-04-20 NOTE — Patient Instructions (Addendum)
[facility] Discharge Inst[facility] for Patients Receiving Chemotherapy  Today you received the following chemotherapy agents VP-16 To help prevent nausea and vomiting after your treatment, we encourage you to take your nausea medication   Take it as often as prescribed.   If you develop nausea and vomiting that is not controlled by your nausea medication, call the clinic. If it is after clinic hours your family physician or the after hours number for the clinic or go to the Emergency Department.   BELOW ARE SYMPTOMS THAT SHOULD BE REPORTED IMMEDIATELY:  *FEVER GREATER THAN 100.5 F  *CHILLS WITH OR WITHOUT FEVER  NAUSEA AND VOMITING THAT IS NOT CONTROLLED WITH YOUR NAUSEA MEDICATION  *UNUSUAL SHORTNESS OF BREATH  *UNUSUAL BRUISING OR BLEEDING  TENDERNESS IN MOUTH AND THROAT WITH OR WITHOUT PRESENCE OF ULCERS  *URINARY PROBLEMS  *BOWEL PROBLEMS  UNUSUAL RASH Items with * indicate a potential emergency and should be followed up as soon as possible.  If this is your first treatment one of the nurses will contact you 24 hours after your treatment. Please let the nurse know about any problems that you may have experienced. Feel free to call the clinic you have any questions or concerns. The clinic phone number is [phone].   I have been informed and understand all the instructions given to me. I know to contact the clinic, my physician, or go to the Emergency Department if any problems should occur. I do not have any questions at this time, but understand that I may call the clinic during office hours   should I have any questions or need assistance in obtaining follow up care.    __________________________________________  _____________  __________ Signature of Patient or Authorized Representative            Date                   Time    __________________________________________ Nurse's Signature    

## 2012-04-21 ENCOUNTER — Ambulatory Visit (HOSPITAL_BASED_OUTPATIENT_CLINIC_OR_DEPARTMENT_OTHER): Payer: Medicaid Other

## 2012-04-21 VITALS — BP 157/76 | HR 78 | Temp 97.8°F

## 2012-04-21 DIAGNOSIS — E119 Type 2 diabetes mellitus without complications: Secondary | ICD-10-CM

## 2012-04-21 DIAGNOSIS — C8280 Other types of follicular lymphoma, unspecified site: Secondary | ICD-10-CM

## 2012-04-21 DIAGNOSIS — I639 Cerebral infarction, unspecified: Secondary | ICD-10-CM

## 2012-04-21 DIAGNOSIS — I10 Essential (primary) hypertension: Secondary | ICD-10-CM

## 2012-04-21 DIAGNOSIS — C8299 Follicular lymphoma, unspecified, extranodal and solid organ sites: Secondary | ICD-10-CM

## 2012-04-21 DIAGNOSIS — I252 Old myocardial infarction: Secondary | ICD-10-CM

## 2012-04-21 DIAGNOSIS — Z5111 Encounter for antineoplastic chemotherapy: Secondary | ICD-10-CM

## 2012-04-21 DIAGNOSIS — Z951 Presence of aortocoronary bypass graft: Secondary | ICD-10-CM

## 2012-04-21 MED ORDER — DIPHENHYDRAMINE HCL 50 MG/ML IJ SOLN
25.0000 mg | Freq: Once | INTRAMUSCULAR | Status: AC
  Administered 2012-04-21: 25 mg via INTRAVENOUS

## 2012-04-21 MED ORDER — DEXAMETHASONE SODIUM PHOSPHATE 10 MG/ML IJ SOLN
10.0000 mg | Freq: Once | INTRAMUSCULAR | Status: AC
  Administered 2012-04-21: 10 mg via INTRAVENOUS

## 2012-04-21 MED ORDER — SODIUM CHLORIDE 0.9 % IV SOLN
Freq: Once | INTRAVENOUS | Status: AC
  Administered 2012-04-21: 14:00:00 via INTRAVENOUS

## 2012-04-21 MED ORDER — HEPARIN SOD (PORK) LOCK FLUSH 100 UNIT/ML IV SOLN
500.0000 [IU] | Freq: Once | INTRAVENOUS | Status: AC | PRN
  Administered 2012-04-21: 500 [IU]
  Filled 2012-04-21: qty 5

## 2012-04-21 MED ORDER — SODIUM CHLORIDE 0.9 % IJ SOLN
10.0000 mL | INTRAMUSCULAR | Status: DC | PRN
  Administered 2012-04-21: 10 mL
  Filled 2012-04-21: qty 10

## 2012-04-21 MED ORDER — SODIUM CHLORIDE 0.9 % IV SOLN
50.0000 mg/m2 | Freq: Once | INTRAVENOUS | Status: AC
  Administered 2012-04-21: 120 mg via INTRAVENOUS
  Filled 2012-04-21: qty 6

## 2012-04-21 NOTE — Patient Instructions (Signed)
[facility] Discharge Instructions for Patients Receiving Chemotherapy  Today you received the following chemotherapy agents Etoposide  To help prevent nausea and vomiting after your treatment, we encourage you to take your nausea medication as prescribed.   If you develop nausea and vomiting that is not controlled by your nausea medication, call the clinic. If it is after clinic hours your family physician or the after hours number for the clinic or go to the Emergency Department.   BELOW ARE SYMPTOMS THAT SHOULD BE REPORTED IMMEDIATELY:  *FEVER GREATER THAN 100.5 F  *CHILLS WITH OR WITHOUT FEVER  NAUSEA AND VOMITING THAT IS NOT CONTROLLED WITH YOUR NAUSEA MEDICATION  *UNUSUAL SHORTNESS OF BREATH  *UNUSUAL BRUISING OR BLEEDING  TENDERNESS IN MOUTH AND THROAT WITH OR WITHOUT PRESENCE OF ULCERS  *URINARY PROBLEMS  *BOWEL PROBLEMS  UNUSUAL RASH Items with * indicate a potential emergency and should be followed up as soon as possible.  One of the nurses will contact you 24 hours after your treatment. Please let the nurse know about any problems that you may have experienced. Feel free to call the clinic you have any questions or concerns. The clinic phone number is [phone].   I have been informed and understand all the instructions given to me. I know to contact the clinic, my physician, or go to the Emergency Department if any problems should occur. I do not have any questions at this time, but understand that I may call the clinic during office hours   should I have any questions or need assistance in obtaining follow up care.    __________________________________________  _____________  __________ Signature of Patient or Authorized Representative            Date                   Time    __________________________________________ Nurse's Signature    

## 2012-04-23 ENCOUNTER — Telehealth: Payer: Self-pay | Admitting: *Deleted

## 2012-04-23 NOTE — Telephone Encounter (Signed)
Late entry:  Zitrhromax powder entered in refill inadvertently.  [facility] notified not to fill powder & informed that pt had script.  Script for zithromax given to pt 6/12 while in office & pt must have taken script somewhere else to be filled.  Wife thought he might have had it filled at [facility] but checked there & not filled there either.  This script should have been for pill form.

## 2012-04-29 ENCOUNTER — Telehealth: Payer: Self-pay | Admitting: *Deleted

## 2012-04-29 DIAGNOSIS — L299 Pruritus, unspecified: Secondary | ICD-10-CM

## 2012-04-29 MED ORDER — METHYLPREDNISOLONE 4 MG PO KIT: PACK | ORAL | Status: AC

## 2012-04-29 NOTE — Telephone Encounter (Signed)
Medrol dose pack called to pharmacy per Dr Cyndie Chime & pt notified.

## 2012-04-29 NOTE — Telephone Encounter (Signed)
Received call from pt stating that he has some itching & rash.  He reports that the itching moves around: legs, knees, thighs, groin, lower back, & face & if he scratches, his skin puffs up (welps) like an allergic reaction.  He reports that this has been going on for several weeks & Lonna Cobb NP thought it was just dry skin & he switched soaps & is using Aveeno with moisturizers & oatmeal & has even tried goldbond which has helped some.  He reports that it comes & goes.  He has taken benadryl but it doesn't seem to help.  Instructed to try to stay cool & OK to use benadryl & can increase to 50 mg q 6 h prn & can try benadryl cream/oint.  Also encouraged increasing po fluids.  Will discuss with Dr Cyndie Chime.

## 2012-05-03 ENCOUNTER — Ambulatory Visit: Payer: BC Managed Care – PPO | Admitting: Oncology

## 2012-05-03 ENCOUNTER — Other Ambulatory Visit: Payer: BC Managed Care – PPO | Admitting: Lab

## 2012-05-04 ENCOUNTER — Other Ambulatory Visit (HOSPITAL_BASED_OUTPATIENT_CLINIC_OR_DEPARTMENT_OTHER): Payer: Medicaid Other

## 2012-05-04 ENCOUNTER — Ambulatory Visit (HOSPITAL_BASED_OUTPATIENT_CLINIC_OR_DEPARTMENT_OTHER): Payer: Medicaid Other | Admitting: Oncology

## 2012-05-04 VITALS — BP 147/96 | HR 83 | Temp 97.8°F | Ht 67.0 in | Wt 247.2 lb

## 2012-05-04 DIAGNOSIS — C8280 Other types of follicular lymphoma, unspecified site: Secondary | ICD-10-CM

## 2012-05-04 DIAGNOSIS — E119 Type 2 diabetes mellitus without complications: Secondary | ICD-10-CM

## 2012-05-04 DIAGNOSIS — C8299 Follicular lymphoma, unspecified, extranodal and solid organ sites: Secondary | ICD-10-CM

## 2012-05-04 DIAGNOSIS — C8589 Other specified types of non-Hodgkin lymphoma, extranodal and solid organ sites: Secondary | ICD-10-CM

## 2012-05-04 DIAGNOSIS — B370 Candidal stomatitis: Secondary | ICD-10-CM

## 2012-05-04 DIAGNOSIS — I2589 Other forms of chronic ischemic heart disease: Secondary | ICD-10-CM

## 2012-05-04 LAB — CBC WITH DIFFERENTIAL/PLATELET
Eosinophils Absolute: 0.1 10*3/uL (ref 0.0–0.5)
HCT: 46.1 % (ref 38.4–49.9)
LYMPH%: 20.7 % (ref 14.0–49.0)
MONO#: 1.4 10*3/uL — ABNORMAL HIGH (ref 0.1–0.9)
NEUT#: 4.8 10*3/uL (ref 1.5–6.5)
NEUT%: 59.8 % (ref 39.0–75.0)
Platelets: 247 10*3/uL (ref 140–400)
WBC: 8 10*3/uL (ref 4.0–10.3)

## 2012-05-04 LAB — COMPREHENSIVE METABOLIC PANEL
ALT: 27 U/L (ref 0–53)
Alkaline Phosphatase: 60 U/L (ref 39–117)
CO2: 26 mEq/L (ref 19–32)
Creatinine, Ser: 0.75 mg/dL (ref 0.50–1.35)
Glucose, Bld: 171 mg/dL — ABNORMAL HIGH (ref 70–99)
Total Bilirubin: 0.6 mg/dL (ref 0.3–1.2)

## 2012-05-05 NOTE — Progress Notes (Signed)
CC:   William Calderon, William Samuel Jester, MD William Calderon. William Calderon, M.D. William Calderon, M.D.  William Calderon has now completed 3 of 4 planned cycles of chemotherapy with a combination of Cytoxan, etoposide, vincristine, and prednisone for a stage IB high-grade B-cell non-Hodgkin lymphoma presenting with isolated but bulky right axillary lymphadenopathy.  Adriamycin was deleted from the regimen due to underlying cardiomyopathy with ejection fraction of 40%.  Etoposide was substituted at 50 mg/sq m days 1, 2 and 3.  Overall, he has tolerated the treatment well.  He is getting some unusual side effects, however.  About a week after the treatment, he gets severe burning in his rectum following a bowel movement.  No constipation.  This is usually followed by a day or two of abdominal cramps and then constipation.  He has not had any oral mucositis.  No paresthesias.  Nausea but no vomiting.  He has lost some but not all of his hair.  Reviewing his blood counts for the first 3 cycles, I noticed today that we are not seeing significant myelosuppression.  He was treated following cycle 2 for oral candida infection.  Over the last few days, he has developed an extensive rash bilaterally in the inguinal region, which on my exam today also appears to be a yeast infection.  EXAM:  Vital Signs:  Blood pressure 147/96, pulse 83 and regular, temp 97.8.  Weight is 248, down from pretreatment weight of 264.  HEENT: There is partial alopecia.  Oropharynx without erythema, exudate, or ulcer.  Lungs:  Clear and resonant to percussion.  Heart:  Regular cardiac rhythm without murmur.  Lymphatic:  There is no residual right axillary adenopathy and no other areas of adenopathy in the neck, left axilla, supraclavicular or inguinal regions.  Abdomen:  Soft and Nontender.GU: extensive, bilateral, inguinal rash punctate on erythematous, serpiginous, base.  No mass, no organomegaly.  Extremities:  No edema.  No  calf tenderness.  Neurologic:  Motor strength is 5/5.  Reflexes 1+, symmetric.  Sensation is intact to vibration.  LAB:  Hemoglobin 15.6, hematocrit 46, white count 8000, with 60% neutrophils, 21% lymphocytes, and platelet count 247,000.  Lowest white count recorded throughout his treatment interval was only 6900.  IMPRESSION: 1. Stage IB high-grade B-cell non-Hodgkin lymphoma, IPI low risk, in a     hypertensive, diabetic patient with an ischemic cardiomyopathy with     a history of a myocardial infarction at age 51 and coronary bypass     surgery in 2003.  He also has cerebrovascular disease and had a     stroke in 2011.  Clinically, he appears to be responding to     treatment with resolution of previous bulky adenopathy.  However, I     have some concern that we are actually not treating him hard     enough.  I am going to make a 25% escalation in his Cytoxan from     750 up to 1000 mg/sq m and a 20% increase in his etoposide from 50     to 60 mg/sq m going into his final planned cycle.  Once counts     recover from chemotherapy, he will be referred to Radiation     Oncology for consideration of involved field radiotherapy. 2. Bilateral inguinal candida infection.  I am starting him on     Diflucan 200 mg day 1, then 100 mg daily, which I will ask him to     stay on until his  blood counts recover from his next cycle of     chemotherapy. 3. Intense rectal burning 1 week following chemotherapy.  I am     prescribing some Anusol hydrocortisone suppositories to start on     the day prior to anticipated symptoms and to continue for 72 hours. 4. Treated oral Candida. 5. Type 2 diabetes, now on oral agent. 6. Coronary artery disease status post myocardial infarction, status     post bypass surgery. 7. Cerebrovascular disease status post stroke. 8. Tobacco addiction.    ______________________________ William Calderon, M.D., F.A.C.P. JMG/MEDQ  D:  05/04/2012  T:  05/05/2012  Job:   [id number]

## 2012-05-06 ENCOUNTER — Telehealth: Payer: Self-pay | Admitting: Oncology

## 2012-05-06 DIAGNOSIS — Z86718 Personal history of other venous thrombosis and embolism: Secondary | ICD-10-CM

## 2012-05-06 DIAGNOSIS — Z86711 Personal history of pulmonary embolism: Secondary | ICD-10-CM

## 2012-05-06 HISTORY — DX: Personal history of pulmonary embolism: Z86.711

## 2012-05-06 HISTORY — DX: Personal history of other venous thrombosis and embolism: Z86.718

## 2012-05-06 NOTE — Telephone Encounter (Signed)
lmonvm for pt on cell re new appts for 8/19 and 8/26. Also left info re ct 8/23. appts for next wk 8/5, 8/6 and 8/7 remains the same and pt to get new schedule when he comes in  On 8/5.

## 2012-05-10 ENCOUNTER — Other Ambulatory Visit (HOSPITAL_BASED_OUTPATIENT_CLINIC_OR_DEPARTMENT_OTHER): Payer: Medicaid Other | Admitting: Lab

## 2012-05-10 ENCOUNTER — Ambulatory Visit (HOSPITAL_BASED_OUTPATIENT_CLINIC_OR_DEPARTMENT_OTHER): Payer: Medicaid Other

## 2012-05-10 ENCOUNTER — Other Ambulatory Visit: Payer: Self-pay | Admitting: Oncology

## 2012-05-10 VITALS — BP 129/75 | HR 83 | Temp 97.7°F | Resp 20

## 2012-05-10 DIAGNOSIS — C8280 Other types of follicular lymphoma, unspecified site: Secondary | ICD-10-CM

## 2012-05-10 DIAGNOSIS — Z5111 Encounter for antineoplastic chemotherapy: Secondary | ICD-10-CM

## 2012-05-10 DIAGNOSIS — E119 Type 2 diabetes mellitus without complications: Secondary | ICD-10-CM

## 2012-05-10 DIAGNOSIS — I252 Old myocardial infarction: Secondary | ICD-10-CM

## 2012-05-10 DIAGNOSIS — C8299 Follicular lymphoma, unspecified, extranodal and solid organ sites: Secondary | ICD-10-CM

## 2012-05-10 DIAGNOSIS — Z951 Presence of aortocoronary bypass graft: Secondary | ICD-10-CM

## 2012-05-10 DIAGNOSIS — I10 Essential (primary) hypertension: Secondary | ICD-10-CM

## 2012-05-10 DIAGNOSIS — Z5112 Encounter for antineoplastic immunotherapy: Secondary | ICD-10-CM

## 2012-05-10 DIAGNOSIS — I2510 Atherosclerotic heart disease of native coronary artery without angina pectoris: Secondary | ICD-10-CM

## 2012-05-10 DIAGNOSIS — I639 Cerebral infarction, unspecified: Secondary | ICD-10-CM

## 2012-05-10 LAB — CBC WITH DIFFERENTIAL/PLATELET
BASO%: 0.9 % (ref 0.0–2.0)
Eosinophils Absolute: 0.4 10*3/uL (ref 0.0–0.5)
LYMPH%: 10.2 % — ABNORMAL LOW (ref 14.0–49.0)
MCHC: 35.7 g/dL (ref 32.0–36.0)
MCV: 85.4 fL (ref 79.3–98.0)
MONO%: 11.5 % (ref 0.0–14.0)
NEUT#: 8.2 10*3/uL — ABNORMAL HIGH (ref 1.5–6.5)
Platelets: 220 10*3/uL (ref 140–400)
RBC: 4.99 10*6/uL (ref 4.20–5.82)
RDW: 14 % (ref 11.0–14.6)
WBC: 11.1 10*3/uL — ABNORMAL HIGH (ref 4.0–10.3)
nRBC: 0 % (ref 0–0)

## 2012-05-10 LAB — TECHNOLOGIST REVIEW

## 2012-05-10 MED ORDER — DIPHENHYDRAMINE HCL 50 MG/ML IJ SOLN: 25.0000 mg | Freq: Once | INTRAMUSCULAR | Status: DC

## 2012-05-10 MED ORDER — SODIUM CHLORIDE 0.9 % IV SOLN
375.0000 mg/m2 | Freq: Once | INTRAVENOUS | Status: AC
  Administered 2012-05-10: 900 mg via INTRAVENOUS
  Filled 2012-05-10: qty 90

## 2012-05-10 MED ORDER — SODIUM CHLORIDE 0.9 % IV SOLN
1000.0000 mg/m2 | Freq: Once | INTRAVENOUS | Status: AC
  Administered 2012-05-10: 2360 mg via INTRAVENOUS
  Filled 2012-05-10: qty 118

## 2012-05-10 MED ORDER — VINCRISTINE SULFATE CHEMO INJECTION 1 MG/ML
2.0000 mg | Freq: Once | INTRAVENOUS | Status: AC
  Administered 2012-05-10: 2 mg via INTRAVENOUS
  Filled 2012-05-10: qty 2

## 2012-05-10 MED ORDER — ACETAMINOPHEN 325 MG PO TABS
650.0000 mg | ORAL_TABLET | Freq: Once | ORAL | Status: AC
  Administered 2012-05-10: 650 mg via ORAL

## 2012-05-10 MED ORDER — SODIUM CHLORIDE 0.9 % IV SOLN
Freq: Once | INTRAVENOUS | Status: AC
  Administered 2012-05-10: 10:00:00 via INTRAVENOUS

## 2012-05-10 MED ORDER — SODIUM CHLORIDE 0.9 % IJ SOLN
10.0000 mL | INTRAMUSCULAR | Status: DC | PRN
  Administered 2012-05-10: 10 mL
  Filled 2012-05-10: qty 10

## 2012-05-10 MED ORDER — HEPARIN SOD (PORK) LOCK FLUSH 100 UNIT/ML IV SOLN
500.0000 [IU] | Freq: Once | INTRAVENOUS | Status: AC | PRN
  Administered 2012-05-10: 500 [IU]
  Filled 2012-05-10: qty 5

## 2012-05-10 MED ORDER — DEXAMETHASONE SODIUM PHOSPHATE 4 MG/ML IJ SOLN
20.0000 mg | Freq: Once | INTRAMUSCULAR | Status: AC
  Administered 2012-05-10: 20 mg via INTRAVENOUS

## 2012-05-10 MED ORDER — PALONOSETRON HCL INJECTION 0.25 MG/5ML
0.2500 mg | Freq: Once | INTRAVENOUS | Status: AC
  Administered 2012-05-10: 0.25 mg via INTRAVENOUS

## 2012-05-10 MED ORDER — DIPHENHYDRAMINE HCL 25 MG PO CAPS
50.0000 mg | ORAL_CAPSULE | Freq: Once | ORAL | Status: AC
  Administered 2012-05-10: 50 mg via ORAL

## 2012-05-10 MED ORDER — SODIUM CHLORIDE 0.9 % IV SOLN
60.0000 mg/m2 | Freq: Once | INTRAVENOUS | Status: AC
  Administered 2012-05-10: 140 mg via INTRAVENOUS
  Filled 2012-05-10: qty 7

## 2012-05-10 NOTE — Patient Instructions (Signed)
[facility] Discharge Instructions for Patients Receiving Chemotherapy  Today you received the following chemotherapy agents Vincristine/Cytoxan/Etoposide and Rituxan.  To help prevent nausea and vomiting after your treatment, we encourage you to take your nausea medication.   If you develop nausea and vomiting that is not controlled by your nausea medication, call the clinic. If it is after clinic hours your family physician or the after hours number for the clinic or go to the Emergency Department.   BELOW ARE SYMPTOMS THAT SHOULD BE REPORTED IMMEDIATELY:  *FEVER GREATER THAN 100.5 F  *CHILLS WITH OR WITHOUT FEVER  NAUSEA AND VOMITING THAT IS NOT CONTROLLED WITH YOUR NAUSEA MEDICATION  *UNUSUAL SHORTNESS OF BREATH  *UNUSUAL BRUISING OR BLEEDING  TENDERNESS IN MOUTH AND THROAT WITH OR WITHOUT PRESENCE OF ULCERS  *URINARY PROBLEMS  *BOWEL PROBLEMS  UNUSUAL RASH Items with * indicate a potential emergency and should be followed up as soon as possible.  One of the nurses will contact you 24 hours after your treatment. Please let the nurse know about any problems that you may have experienced. Feel free to call the clinic you have any questions or concerns. The clinic phone number is [phone].   I have been informed and understand all the instructions given to me. I know to contact the clinic, my physician, or go to the Emergency Department if any problems should occur. I do not have any questions at this time, but understand that I may call the clinic during office hours   should I have any questions or need assistance in obtaining follow up care.    __________________________________________  _____________  __________ Signature of Patient or Authorized Representative            Date                   Time    __________________________________________ Nurse's Signature

## 2012-05-10 NOTE — Progress Notes (Signed)
0940--Patient states that previously reported rash in bi-inguinal area, has spread to sides/flank area, getting worse, very itchy, treating with home remedies/oral meds by MD. Dr Cyndie Chime made aware.

## 2012-05-11 ENCOUNTER — Ambulatory Visit (HOSPITAL_BASED_OUTPATIENT_CLINIC_OR_DEPARTMENT_OTHER): Payer: Medicaid Other

## 2012-05-11 VITALS — BP 151/74 | HR 46 | Temp 96.7°F | Resp 20

## 2012-05-11 DIAGNOSIS — C8280 Other types of follicular lymphoma, unspecified site: Secondary | ICD-10-CM

## 2012-05-11 DIAGNOSIS — I10 Essential (primary) hypertension: Secondary | ICD-10-CM

## 2012-05-11 DIAGNOSIS — C8299 Follicular lymphoma, unspecified, extranodal and solid organ sites: Secondary | ICD-10-CM

## 2012-05-11 DIAGNOSIS — Z5111 Encounter for antineoplastic chemotherapy: Secondary | ICD-10-CM

## 2012-05-11 DIAGNOSIS — I252 Old myocardial infarction: Secondary | ICD-10-CM

## 2012-05-11 DIAGNOSIS — E119 Type 2 diabetes mellitus without complications: Secondary | ICD-10-CM

## 2012-05-11 DIAGNOSIS — I639 Cerebral infarction, unspecified: Secondary | ICD-10-CM

## 2012-05-11 DIAGNOSIS — Z951 Presence of aortocoronary bypass graft: Secondary | ICD-10-CM

## 2012-05-11 MED ORDER — SODIUM CHLORIDE 0.9 % IJ SOLN
10.0000 mL | INTRAMUSCULAR | Status: DC | PRN
  Filled 2012-05-11: qty 10

## 2012-05-11 MED ORDER — SODIUM CHLORIDE 0.9 % IV SOLN
60.0000 mg/m2 | Freq: Once | INTRAVENOUS | Status: AC
  Administered 2012-05-11: 140 mg via INTRAVENOUS
  Filled 2012-05-11: qty 7

## 2012-05-11 MED ORDER — HEPARIN SOD (PORK) LOCK FLUSH 100 UNIT/ML IV SOLN
500.0000 [IU] | Freq: Once | INTRAVENOUS | Status: DC | PRN
  Filled 2012-05-11: qty 5

## 2012-05-11 MED ORDER — DIPHENHYDRAMINE HCL 50 MG/ML IJ SOLN
25.0000 mg | Freq: Once | INTRAMUSCULAR | Status: AC
  Administered 2012-05-11: 25 mg via INTRAVENOUS

## 2012-05-11 MED ORDER — DEXAMETHASONE SODIUM PHOSPHATE 10 MG/ML IJ SOLN
10.0000 mg | Freq: Once | INTRAMUSCULAR | Status: AC
  Administered 2012-05-11: 10 mg via INTRAVENOUS

## 2012-05-11 MED ORDER — SODIUM CHLORIDE 0.9 % IV SOLN
Freq: Once | INTRAVENOUS | Status: AC
  Administered 2012-05-11: 15:00:00 via INTRAVENOUS

## 2012-05-11 NOTE — Patient Instructions (Signed)
[facility] Discharge Instructions for Patients Receiving Chemotherapy  Today you received the following chemotherapy agent Etoposide (VP-16).  To help prevent nausea and vomiting after your treatment, we encourage you to take your nausea medication. Begin taking it as often as prescribed for by Dr. Cyndie Chime.    If you develop nausea and vomiting that is not controlled by your nausea medication, call the clinic. If it is after clinic hours your family physician or the after hours number for the clinic or go to the Emergency Department.   BELOW ARE SYMPTOMS THAT SHOULD BE REPORTED IMMEDIATELY:  *FEVER GREATER THAN 100.5 F  *CHILLS WITH OR WITHOUT FEVER  NAUSEA AND VOMITING THAT IS NOT CONTROLLED WITH YOUR NAUSEA MEDICATION  *UNUSUAL SHORTNESS OF BREATH  *UNUSUAL BRUISING OR BLEEDING  TENDERNESS IN MOUTH AND THROAT WITH OR WITHOUT PRESENCE OF ULCERS  *URINARY PROBLEMS  *BOWEL PROBLEMS  UNUSUAL RASH Items with * indicate a potential emergency and should be followed up as soon as possible.  One of the nurses will contact you 24 hours after your treatment. Please let the nurse know about any problems that you may have experienced. Feel free to call the clinic you have any questions or concerns. The clinic phone number is [phone].   I have been informed and understand all the instructions given to me. I know to contact the clinic, my physician, or go to the Emergency Department if any problems should occur. I do not have any questions at this time, but understand that I may call the clinic during office hours   should I have any questions or need assistance in obtaining follow up care.    __________________________________________  _____________  __________ Signature of Patient or Authorized Representative            Date                   Time    __________________________________________ Nurse's Signature

## 2012-05-12 ENCOUNTER — Ambulatory Visit (HOSPITAL_BASED_OUTPATIENT_CLINIC_OR_DEPARTMENT_OTHER): Payer: Medicaid Other

## 2012-05-12 VITALS — BP 153/85 | HR 72 | Temp 96.7°F

## 2012-05-12 DIAGNOSIS — Z5111 Encounter for antineoplastic chemotherapy: Secondary | ICD-10-CM

## 2012-05-12 DIAGNOSIS — C8299 Follicular lymphoma, unspecified, extranodal and solid organ sites: Secondary | ICD-10-CM

## 2012-05-12 DIAGNOSIS — I639 Cerebral infarction, unspecified: Secondary | ICD-10-CM

## 2012-05-12 DIAGNOSIS — I252 Old myocardial infarction: Secondary | ICD-10-CM

## 2012-05-12 DIAGNOSIS — I10 Essential (primary) hypertension: Secondary | ICD-10-CM

## 2012-05-12 DIAGNOSIS — E119 Type 2 diabetes mellitus without complications: Secondary | ICD-10-CM

## 2012-05-12 DIAGNOSIS — Z951 Presence of aortocoronary bypass graft: Secondary | ICD-10-CM

## 2012-05-12 DIAGNOSIS — C8280 Other types of follicular lymphoma, unspecified site: Secondary | ICD-10-CM

## 2012-05-12 MED ORDER — SODIUM CHLORIDE 0.9 % IV SOLN
60.0000 mg/m2 | Freq: Once | INTRAVENOUS | Status: AC
  Administered 2012-05-12: 140 mg via INTRAVENOUS
  Filled 2012-05-12: qty 7

## 2012-05-12 MED ORDER — SODIUM CHLORIDE 0.9 % IV SOLN
Freq: Once | INTRAVENOUS | Status: AC
  Administered 2012-05-12: 15:00:00 via INTRAVENOUS

## 2012-05-12 MED ORDER — DIPHENHYDRAMINE HCL 50 MG/ML IJ SOLN
25.0000 mg | Freq: Once | INTRAMUSCULAR | Status: AC
  Administered 2012-05-12: 25 mg via INTRAVENOUS

## 2012-05-12 MED ORDER — DEXAMETHASONE SODIUM PHOSPHATE 10 MG/ML IJ SOLN
10.0000 mg | Freq: Once | INTRAMUSCULAR | Status: AC
  Administered 2012-05-12: 10 mg via INTRAVENOUS

## 2012-05-14 ENCOUNTER — Ambulatory Visit
Admission: RE | Admit: 2012-05-14 | Discharge: 2012-05-14 | Disposition: A | Discharge status: Home / Self Care | Payer: Medicaid Other | Source: Ambulatory Visit | Attending: Radiation Oncology | Admitting: Radiation Oncology

## 2012-05-14 ENCOUNTER — Encounter: Payer: Self-pay | Admitting: Radiation Oncology

## 2012-05-14 VITALS — BP 120/80 | HR 74 | Temp 97.9°F | Wt 248.6 lb

## 2012-05-14 DIAGNOSIS — C8280 Other types of follicular lymphoma, unspecified site: Secondary | ICD-10-CM

## 2012-05-14 DIAGNOSIS — E119 Type 2 diabetes mellitus without complications: Secondary | ICD-10-CM | POA: Insufficient documentation

## 2012-05-14 DIAGNOSIS — I428 Other cardiomyopathies: Secondary | ICD-10-CM | POA: Insufficient documentation

## 2012-05-14 DIAGNOSIS — F40240 Claustrophobia: Secondary | ICD-10-CM

## 2012-05-14 DIAGNOSIS — F40298 Other specified phobia: Secondary | ICD-10-CM | POA: Insufficient documentation

## 2012-05-14 DIAGNOSIS — C8589 Other specified types of non-Hodgkin lymphoma, extranodal and solid organ sites: Secondary | ICD-10-CM | POA: Insufficient documentation

## 2012-05-14 DIAGNOSIS — I10 Essential (primary) hypertension: Secondary | ICD-10-CM | POA: Insufficient documentation

## 2012-05-14 DIAGNOSIS — Z951 Presence of aortocoronary bypass graft: Secondary | ICD-10-CM | POA: Insufficient documentation

## 2012-05-14 DIAGNOSIS — Z7982 Long term (current) use of aspirin: Secondary | ICD-10-CM | POA: Insufficient documentation

## 2012-05-14 DIAGNOSIS — I2510 Atherosclerotic heart disease of native coronary artery without angina pectoris: Secondary | ICD-10-CM | POA: Insufficient documentation

## 2012-05-14 DIAGNOSIS — Z79899 Other long term (current) drug therapy: Secondary | ICD-10-CM | POA: Insufficient documentation

## 2012-05-14 DIAGNOSIS — I252 Old myocardial infarction: Secondary | ICD-10-CM | POA: Insufficient documentation

## 2012-05-14 DIAGNOSIS — Z510 Encounter for antineoplastic radiation therapy: Secondary | ICD-10-CM | POA: Insufficient documentation

## 2012-05-14 MED ORDER — LORAZEPAM 1 MG PO TABS: 1.0000 mg | ORAL_TABLET | ORAL | Status: AC | PRN

## 2012-05-14 NOTE — Progress Notes (Signed)
Patient  diagnosed High grade B-cell NHL with right axilla involvement.Has completed 3 cycles chemotherapy of cytoxan,etoposide and vincristine.Patient is feeling washed out today as had chemotherapy on Wednesday.

## 2012-05-14 NOTE — Progress Notes (Signed)
Please see the Nurse Progress Note in the MD Initial Consult Encounter for this patient. 

## 2012-05-15 ENCOUNTER — Encounter: Payer: Self-pay | Admitting: Radiation Oncology

## 2012-05-15 DIAGNOSIS — C8594 Non-Hodgkin lymphoma, unspecified, lymph nodes of axilla and upper limb: Secondary | ICD-10-CM | POA: Insufficient documentation
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
## 2012-05-17 ENCOUNTER — Other Ambulatory Visit: Payer: Self-pay | Admitting: *Deleted

## 2012-05-17 DIAGNOSIS — C8590 Non-Hodgkin lymphoma, unspecified, unspecified site: Secondary | ICD-10-CM

## 2012-05-17 MED ORDER — OXYCODONE HCL 5 MG PO TABS: ORAL_TABLET | ORAL | Status: DC

## 2012-05-17 MED ORDER — HYDROCORTISONE ACETATE 25 MG RE SUPP: RECTAL | Status: DC

## 2012-05-17 MED ORDER — FLUCONAZOLE 100 MG PO TABS: ORAL_TABLET | ORAL | Status: DC

## 2012-05-24 ENCOUNTER — Other Ambulatory Visit (HOSPITAL_BASED_OUTPATIENT_CLINIC_OR_DEPARTMENT_OTHER): Payer: Medicaid Other | Admitting: Lab

## 2012-05-24 DIAGNOSIS — C8299 Follicular lymphoma, unspecified, extranodal and solid organ sites: Secondary | ICD-10-CM

## 2012-05-24 DIAGNOSIS — C8280 Other types of follicular lymphoma, unspecified site: Secondary | ICD-10-CM

## 2012-05-24 LAB — COMPREHENSIVE METABOLIC PANEL
Alkaline Phosphatase: 63 U/L (ref 39–117)
BUN: 7 mg/dL (ref 6–23)
CO2: 25 mEq/L (ref 19–32)
Creatinine, Ser: 0.68 mg/dL (ref 0.50–1.35)
Glucose, Bld: 192 mg/dL — ABNORMAL HIGH (ref 70–99)
Sodium: 139 mEq/L (ref 135–145)
Total Bilirubin: 0.9 mg/dL (ref 0.3–1.2)
Total Protein: 6.5 g/dL (ref 6.0–8.3)

## 2012-05-24 LAB — CBC WITH DIFFERENTIAL/PLATELET
Eosinophils Absolute: 0 10*3/uL (ref 0.0–0.5)
HCT: 39.9 % (ref 38.4–49.9)
LYMPH%: 32 % (ref 14.0–49.0)
MCV: 88.1 fL (ref 79.3–98.0)
MONO#: 0.8 10*3/uL (ref 0.1–0.9)
MONO%: 26.6 % — ABNORMAL HIGH (ref 0.0–14.0)
NEUT#: 1.2 10*3/uL — ABNORMAL LOW (ref 1.5–6.5)
NEUT%: 39.9 % (ref 39.0–75.0)
Platelets: 184 10*3/uL (ref 140–400)
RBC: 4.53 10*6/uL (ref 4.20–5.82)

## 2012-05-24 LAB — LACTATE DEHYDROGENASE: LDH: 156 U/L (ref 94–250)

## 2012-05-25 ENCOUNTER — Telehealth: Payer: Self-pay | Admitting: *Deleted

## 2012-05-25 ENCOUNTER — Ambulatory Visit (HOSPITAL_COMMUNITY)
Admission: RE | Admit: 2012-05-25 | Discharge: 2012-05-25 | Disposition: A | Discharge status: Home / Self Care | Payer: Medicaid Other | Source: Ambulatory Visit | Attending: Oncology | Admitting: Oncology

## 2012-05-25 ENCOUNTER — Ambulatory Visit (HOSPITAL_BASED_OUTPATIENT_CLINIC_OR_DEPARTMENT_OTHER): Payer: Medicaid Other | Admitting: Oncology

## 2012-05-25 ENCOUNTER — Other Ambulatory Visit: Payer: Self-pay | Admitting: *Deleted

## 2012-05-25 VITALS — BP 164/97 | HR 106 | Temp 98.7°F | Resp 18 | Ht 67.0 in | Wt 249.0 lb

## 2012-05-25 DIAGNOSIS — M79609 Pain in unspecified limb: Secondary | ICD-10-CM

## 2012-05-25 DIAGNOSIS — M7989 Other specified soft tissue disorders: Secondary | ICD-10-CM | POA: Insufficient documentation

## 2012-05-25 DIAGNOSIS — C8299 Follicular lymphoma, unspecified, extranodal and solid organ sites: Secondary | ICD-10-CM

## 2012-05-25 DIAGNOSIS — I82409 Acute embolism and thrombosis of unspecified deep veins of unspecified lower extremity: Secondary | ICD-10-CM

## 2012-05-25 DIAGNOSIS — C8594 Non-Hodgkin lymphoma, unspecified, lymph nodes of axilla and upper limb: Secondary | ICD-10-CM

## 2012-05-25 DIAGNOSIS — R600 Localized edema: Secondary | ICD-10-CM

## 2012-05-25 DIAGNOSIS — R609 Edema, unspecified: Secondary | ICD-10-CM

## 2012-05-25 MED ORDER — ENOXAPARIN SODIUM 100 MG/ML ~~LOC~~ SOLN: 100.0000 mg | Freq: Two times a day (BID) | SUBCUTANEOUS | Status: DC

## 2012-05-25 MED ORDER — ENOXAPARIN SODIUM 120 MG/0.8ML ~~LOC~~ SOLN
112.0000 mg | Freq: Once | SUBCUTANEOUS | Status: AC
  Administered 2012-05-25: 115 mg via SUBCUTANEOUS
  Filled 2012-05-25: qty 0.8

## 2012-05-25 NOTE — Progress Notes (Signed)
Left: DVT noted in the popliteal, posterior tibial, and peroneal veins.  No evidence of superficial thrombosis.  No Baker's cyst.

## 2012-05-25 NOTE — Progress Notes (Signed)
   [facility]    OFFICE PROGRESS NOTE   INTERVAL HISTORY:   He is followed by Dr. Cyndie Chime for non-Hodgkin's lymphoma. He completed the most recent cycle of chemotherapy beginning on 05/10/2012. He reports tolerating the chemotherapy well. No nausea. He has a history of intermittent headaches since beginning chemotherapy. The headaches have improved.  He noted the onset of pain in the left calf beginning approximately 3 days ago. Pain and "tightness "in the left Have progressed. He telephoned the office with this complaint earlier today. A Doppler of the left leg confirmed a deep vein thrombosis involving the popliteal, posterior tibial, and peroneal veins.  He reports no previous history of venous thrombosis. He had a CVA several years ago.  Objective:  Vital signs in last 24 hours:  Blood pressure 164/97, pulse 106, temperature 98.7 F (37.1 C), temperature source Oral, resp. rate 18, height 5\' 7"  (1.702 m), weight 249 lb (112.946 kg).    HEENT: No thrush or ulcers Resp: Clear bilaterally with distant breath sounds, no respiratory distress Cardio: Regular rate and rhythm GI: Nontender, no hepatosplenomegaly Vascular: No significant edema noted throughout the left leg. Mild asymmetry with an increase in the size of the left lower leg near the ankle. Mild erythema over the left calf with tenderness. No palpable cord. No apparent thigh edema.  Portacath/PICC-without erythema  Lab Results:  Lab Results  Component Value Date   WBC 2.9* 05/24/2012   HGB 13.6 05/24/2012   HCT 39.9 05/24/2012   MCV 88.1 05/24/2012   PLT 184 05/24/2012   ANC 1.2    Medications: I have reviewed the patient's current medications.  Assessment/Plan: 1.Stage IB high-grade B-cell non-Hodgkin lymphoma, IPI low risk, in a  hypertensive, diabetic patient with an ischemic cardiomyopathy with  a history of a myocardial infarction at age 22 and coronary bypass  surgery in 2003. He also  has cerebrovascular disease and had a  stroke in 2011. He completed the fourth and final planned cycle of chemotherapy on 05/10/2012. He will be referred for radiation consolidation. 2. Bilateral inguinal candida infection-maintained on Diflucan  3. history of Intense rectal burning 1 week following chemotherapy. Treated with Anusol HC  4. Treated oral Candida.  5. Type 2 diabetes, now on oral agent.  6. Coronary artery disease status post myocardial infarction, status  post bypass surgery.  7. Cerebrovascular disease status post stroke.  8. Tobacco addiction. 9. Left lower extremity deep vein thrombosis diagnosed on a Doppler of the left leg 05/25/2012   Disposition:  He is now at day 16 following cycle 4 of systemic chemotherapy given for treatment of non-Hodgkin's lymphoma. He presents with a symptomatic DVT of the left leg. He will begin Lovenox anticoagulate should. I discussed the potential side effects of Lovenox including the chance for bleeding and HIT. He will continue aspirin.  We decided to treat him with twice daily Lovenox at a dose of 1 mg per kilogram. Dr. Cyndie Chime may decide to convert him to Coumadin in the next few weeks.  He will return for a CBC on 05/28/2012. We will arrange for a followup appointment with Dr. Cyndie Chime within the next one to 2 weeks. The patient and his wife were instructed on self injection of Lovenox.   Thornton Papas, MD  05/25/2012  4:59 PM

## 2012-05-25 NOTE — Telephone Encounter (Signed)
Received call from pt wife stating pt c/o of left lower leg pain, "swollen, hurts when flexed, and veins puffy on that side"  Denies redness, tingling, not warm to touch.  Reviewed with Dr. Truett Perna, orders received for lower extremity doppler today.  Returned call to pt wife, doppler scheduled today at 3pm, she verbalized understanding to go to [facility] admitting.

## 2012-05-26 ENCOUNTER — Inpatient Hospital Stay (HOSPITAL_COMMUNITY)
Admission: EM | Admit: 2012-05-26 | Discharge: 2012-05-29 | DRG: 176 | Disposition: A | Discharge status: Home / Self Care | Payer: Medicaid Other | Attending: Internal Medicine | Admitting: Internal Medicine

## 2012-05-26 ENCOUNTER — Emergency Department (HOSPITAL_COMMUNITY): Payer: Medicaid Other

## 2012-05-26 ENCOUNTER — Other Ambulatory Visit: Payer: Self-pay

## 2012-05-26 ENCOUNTER — Encounter (HOSPITAL_COMMUNITY): Payer: Self-pay | Admitting: Emergency Medicine

## 2012-05-26 DIAGNOSIS — E785 Hyperlipidemia, unspecified: Secondary | ICD-10-CM

## 2012-05-26 DIAGNOSIS — I639 Cerebral infarction, unspecified: Secondary | ICD-10-CM

## 2012-05-26 DIAGNOSIS — I472 Ventricular tachycardia: Secondary | ICD-10-CM

## 2012-05-26 DIAGNOSIS — I10 Essential (primary) hypertension: Secondary | ICD-10-CM | POA: Diagnosis present

## 2012-05-26 DIAGNOSIS — C8280 Other types of follicular lymphoma, unspecified site: Secondary | ICD-10-CM

## 2012-05-26 DIAGNOSIS — C8589 Other specified types of non-Hodgkin lymphoma, extranodal and solid organ sites: Secondary | ICD-10-CM

## 2012-05-26 DIAGNOSIS — Z01810 Encounter for preprocedural cardiovascular examination: Secondary | ICD-10-CM

## 2012-05-26 DIAGNOSIS — I252 Old myocardial infarction: Secondary | ICD-10-CM

## 2012-05-26 DIAGNOSIS — Z951 Presence of aortocoronary bypass graft: Secondary | ICD-10-CM

## 2012-05-26 DIAGNOSIS — IMO0002 Reserved for concepts with insufficient information to code with codable children: Secondary | ICD-10-CM | POA: Diagnosis present

## 2012-05-26 DIAGNOSIS — I2699 Other pulmonary embolism without acute cor pulmonale: Principal | ICD-10-CM | POA: Diagnosis present

## 2012-05-26 DIAGNOSIS — E119 Type 2 diabetes mellitus without complications: Secondary | ICD-10-CM

## 2012-05-26 DIAGNOSIS — W3400XA Accidental discharge from unspecified firearms or gun, initial encounter: Secondary | ICD-10-CM

## 2012-05-26 DIAGNOSIS — R2230 Localized swelling, mass and lump, unspecified upper limb: Secondary | ICD-10-CM | POA: Diagnosis present

## 2012-05-26 DIAGNOSIS — I4729 Other ventricular tachycardia: Secondary | ICD-10-CM | POA: Diagnosis not present

## 2012-05-26 DIAGNOSIS — E1151 Type 2 diabetes mellitus with diabetic peripheral angiopathy without gangrene: Secondary | ICD-10-CM | POA: Diagnosis present

## 2012-05-26 DIAGNOSIS — I82409 Acute embolism and thrombosis of unspecified deep veins of unspecified lower extremity: Secondary | ICD-10-CM | POA: Diagnosis present

## 2012-05-26 DIAGNOSIS — C8590 Non-Hodgkin lymphoma, unspecified, unspecified site: Secondary | ICD-10-CM

## 2012-05-26 DIAGNOSIS — I2510 Atherosclerotic heart disease of native coronary artery without angina pectoris: Secondary | ICD-10-CM | POA: Diagnosis present

## 2012-05-26 DIAGNOSIS — D6859 Other primary thrombophilia: Secondary | ICD-10-CM | POA: Diagnosis present

## 2012-05-26 DIAGNOSIS — Z720 Tobacco use: Secondary | ICD-10-CM | POA: Diagnosis present

## 2012-05-26 DIAGNOSIS — I498 Other specified cardiac arrhythmias: Secondary | ICD-10-CM | POA: Diagnosis present

## 2012-05-26 DIAGNOSIS — F17200 Nicotine dependence, unspecified, uncomplicated: Secondary | ICD-10-CM | POA: Diagnosis present

## 2012-05-26 DIAGNOSIS — C8594 Non-Hodgkin lymphoma, unspecified, lymph nodes of axilla and upper limb: Secondary | ICD-10-CM

## 2012-05-26 DIAGNOSIS — R51 Headache: Secondary | ICD-10-CM

## 2012-05-26 DIAGNOSIS — Z8673 Personal history of transient ischemic attack (TIA), and cerebral infarction without residual deficits: Secondary | ICD-10-CM

## 2012-05-26 LAB — CBC WITH DIFFERENTIAL/PLATELET
Basophils Absolute: 0.1 10*3/uL (ref 0.0–0.1)
Basophils Relative: 3 % — ABNORMAL HIGH (ref 0–1)
Eosinophils Relative: 1 % (ref 0–5)
HCT: 40.1 % (ref 39.0–52.0)
Hemoglobin: 13.9 g/dL (ref 13.0–17.0)
Lymphocytes Relative: 24 % (ref 12–46)
Lymphs Abs: 1 10*3/uL (ref 0.7–4.0)
MCH: 30.1 pg (ref 26.0–34.0)
MCHC: 34.7 g/dL (ref 30.0–36.0)
Monocytes Absolute: 1 10*3/uL (ref 0.1–1.0)
Neutro Abs: 2 10*3/uL (ref 1.7–7.7)
RBC: 4.62 MIL/uL (ref 4.22–5.81)

## 2012-05-26 LAB — COMPREHENSIVE METABOLIC PANEL
AST: 19 U/L (ref 0–37)
Albumin: 3.7 g/dL (ref 3.5–5.2)
Calcium: 9.6 mg/dL (ref 8.4–10.5)
Chloride: 98 mEq/L (ref 96–112)
Creatinine, Ser: 0.65 mg/dL (ref 0.50–1.35)

## 2012-05-26 LAB — TROPONIN I: Troponin I: <0.3 ng/mL (ref <0.30)

## 2012-05-26 LAB — PROTIME-INR: INR: 1 (ref 0.00–1.49)

## 2012-05-26 LAB — APTT: aPTT: 35 seconds (ref 24–37)

## 2012-05-26 MED ORDER — ONDANSETRON HCL 4 MG/2ML IJ SOLN
4.0000 mg | Freq: Once | INTRAMUSCULAR | Status: AC
  Administered 2012-05-26: 4 mg via INTRAVENOUS
  Filled 2012-05-26: qty 2

## 2012-05-26 MED ORDER — SODIUM CHLORIDE 0.9 % IV SOLN
INTRAVENOUS | Status: AC
  Administered 2012-05-26: 23:00:00 via INTRAVENOUS

## 2012-05-26 MED ORDER — SODIUM CHLORIDE 0.9 % IV SOLN
Freq: Once | INTRAVENOUS | Status: AC
  Administered 2012-05-26: 21:00:00 via INTRAVENOUS

## 2012-05-26 MED ORDER — HEPARIN BOLUS VIA INFUSION
4000.0000 [IU] | Freq: Once | INTRAVENOUS | Status: AC
  Administered 2012-05-26: 4000 [IU] via INTRAVENOUS

## 2012-05-26 MED ORDER — HEPARIN (PORCINE) IN NACL 100-0.45 UNIT/ML-% IJ SOLN
1700.0000 [IU]/h | INTRAMUSCULAR | Status: DC
  Administered 2012-05-26: 1400 [IU]/h via INTRAVENOUS
  Filled 2012-05-26 (×2): qty 250

## 2012-05-26 MED ORDER — FENTANYL CITRATE 0.05 MG/ML IJ SOLN
50.0000 ug | Freq: Once | INTRAMUSCULAR | Status: AC
  Administered 2012-05-26: 50 ug via INTRAVENOUS
  Filled 2012-05-26: qty 2

## 2012-05-26 MED ORDER — IOHEXOL 350 MG/ML SOLN
100.0000 mL | Freq: Once | INTRAVENOUS | Status: AC | PRN
  Administered 2012-05-26: 100 mL via INTRAVENOUS
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
## 2012-05-26 NOTE — ED Provider Notes (Signed)
William Calderon is a 51 y.o. male  who was diagnosed with a left leg DVT by an oncologist yesterday, and today developed right lower chest discomfort that was worse with deep breathing. He feels better after treatment with with IV analgesia, here. O2 saturation normal. He is mildly tachycardic. CT angiogram chest is consistent with bilateral pulmonary emboli with possible right lung infarct. I will discuss treatment options with his oncology service, and then likely admit, the patient for monitoring.  I discussed the case with Dr.Rubin, who advises full dose heparin, and agrees with admission. [facility] admit the pt.  Flint Melter, MD 05/26/12 2215

## 2012-05-26 NOTE — ED Notes (Signed)
Pt alert, arrives from home, c/o right sided flank pain, onset was today, pt seen in ED dx with DVT left leg yesterday, resp even and shallow, skin pwd
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
## 2012-05-26 NOTE — ED Notes (Signed)
Pt is aware of the need for urine. Urinal at bedside.  

## 2012-05-27 ENCOUNTER — Encounter (HOSPITAL_COMMUNITY): Payer: Self-pay | Admitting: *Deleted

## 2012-05-27 DIAGNOSIS — I059 Rheumatic mitral valve disease, unspecified: Secondary | ICD-10-CM

## 2012-05-27 DIAGNOSIS — C8589 Other specified types of non-Hodgkin lymphoma, extranodal and solid organ sites: Secondary | ICD-10-CM

## 2012-05-27 LAB — CBC
HCT: 36.5 % — ABNORMAL LOW (ref 39.0–52.0)
MCH: 29.5 pg (ref 26.0–34.0)
MCV: 88.2 fL (ref 78.0–100.0)
Platelets: 269 10*3/uL (ref 150–400)
RBC: 4.14 MIL/uL — ABNORMAL LOW (ref 4.22–5.81)

## 2012-05-27 LAB — URINALYSIS, ROUTINE W REFLEX MICROSCOPIC
Bilirubin Urine: NEGATIVE
Glucose, UA: NEGATIVE mg/dL
Ketones, ur: NEGATIVE mg/dL
pH: 6 (ref 5.0–8.0)

## 2012-05-27 LAB — GLUCOSE, CAPILLARY: Glucose-Capillary: 201 mg/dL — ABNORMAL HIGH (ref 70–99)

## 2012-05-27 LAB — BASIC METABOLIC PANEL
CO2: 24 mEq/L (ref 19–32)
Calcium: 9.2 mg/dL (ref 8.4–10.5)
Chloride: 99 mEq/L (ref 96–112)
Glucose, Bld: 177 mg/dL — ABNORMAL HIGH (ref 70–99)
Sodium: 135 mEq/L (ref 135–145)

## 2012-05-27 LAB — HEPARIN LEVEL (UNFRACTIONATED): Heparin Unfractionated: <0.1 IU/mL — ABNORMAL LOW (ref 0.30–0.70)

## 2012-05-27 MED ORDER — INSULIN ASPART 100 UNIT/ML ~~LOC~~ SOLN
0.0000 [IU] | Freq: Three times a day (TID) | SUBCUTANEOUS | Status: DC
  Administered 2012-05-27 (×2): 5 [IU] via SUBCUTANEOUS
  Administered 2012-05-27 – 2012-05-29 (×5): 3 [IU] via SUBCUTANEOUS

## 2012-05-27 MED ORDER — ONDANSETRON HCL 4 MG/2ML IJ SOLN: 4.0000 mg | Freq: Four times a day (QID) | INTRAMUSCULAR | Status: DC | PRN

## 2012-05-27 MED ORDER — INSULIN ASPART 100 UNIT/ML ~~LOC~~ SOLN: 0.0000 [IU] | Freq: Every day | SUBCUTANEOUS | Status: DC

## 2012-05-27 MED ORDER — ASPIRIN 81 MG PO CHEW
81.0000 mg | CHEWABLE_TABLET | Freq: Every day | ORAL | Status: DC
  Administered 2012-05-27 – 2012-05-29 (×3): 81 mg via ORAL
  Filled 2012-05-27 (×3): qty 1

## 2012-05-27 MED ORDER — LORAZEPAM 2 MG/ML IJ SOLN
1.0000 mg | INTRAMUSCULAR | Status: DC | PRN
  Administered 2012-05-27 – 2012-05-28 (×3): 1 mg via INTRAVENOUS
  Filled 2012-05-27 (×3): qty 1

## 2012-05-27 MED ORDER — DOCUSATE SODIUM 100 MG PO CAPS
100.0000 mg | ORAL_CAPSULE | Freq: Two times a day (BID) | ORAL | Status: DC
  Administered 2012-05-27 – 2012-05-29 (×4): 100 mg via ORAL
  Filled 2012-05-27 (×6): qty 1

## 2012-05-27 MED ORDER — HEPARIN BOLUS VIA INFUSION
4000.0000 [IU] | Freq: Once | INTRAVENOUS | Status: AC
  Administered 2012-05-27: 4000 [IU] via INTRAVENOUS
  Filled 2012-05-27: qty 4000

## 2012-05-27 MED ORDER — HYDROMORPHONE HCL PF 1 MG/ML IJ SOLN
1.0000 mg | INTRAMUSCULAR | Status: DC | PRN
  Administered 2012-05-27 (×7): 1 mg via INTRAVENOUS
  Filled 2012-05-27 (×8): qty 1

## 2012-05-27 MED ORDER — ONDANSETRON HCL 4 MG PO TABS: 4.0000 mg | ORAL_TABLET | Freq: Four times a day (QID) | ORAL | Status: DC | PRN

## 2012-05-27 MED ORDER — LOSARTAN POTASSIUM 50 MG PO TABS
100.0000 mg | ORAL_TABLET | Freq: Every day | ORAL | Status: DC
  Administered 2012-05-27 – 2012-05-28 (×2): 100 mg via ORAL
  Filled 2012-05-27 (×3): qty 2

## 2012-05-27 MED ORDER — ZOLPIDEM TARTRATE 5 MG PO TABS: 5.0000 mg | ORAL_TABLET | Freq: Every evening | ORAL | Status: DC | PRN

## 2012-05-27 MED ORDER — AMLODIPINE BESYLATE 10 MG PO TABS
10.0000 mg | ORAL_TABLET | Freq: Every day | ORAL | Status: DC
  Administered 2012-05-27 – 2012-05-29 (×3): 10 mg via ORAL
  Filled 2012-05-27 (×3): qty 1

## 2012-05-27 MED ORDER — SODIUM CHLORIDE 0.9 % IV SOLN
INTRAVENOUS | Status: DC
  Administered 2012-05-27 (×2): via INTRAVENOUS

## 2012-05-27 MED ORDER — HEPARIN BOLUS VIA INFUSION
4000.0000 [IU] | Freq: Once | INTRAVENOUS | Status: AC
  Administered 2012-05-28: 4000 [IU] via INTRAVENOUS
  Filled 2012-05-27: qty 4000

## 2012-05-27 MED ORDER — SODIUM CHLORIDE 0.9 % IJ SOLN
3.0000 mL | Freq: Two times a day (BID) | INTRAMUSCULAR | Status: DC
  Administered 2012-05-28 – 2012-05-29 (×2): 3 mL via INTRAVENOUS

## 2012-05-27 MED ORDER — HEPARIN (PORCINE) IN NACL 100-0.45 UNIT/ML-% IJ SOLN
2300.0000 [IU]/h | INTRAMUSCULAR | Status: DC
  Administered 2012-05-27: 2300 [IU]/h via INTRAVENOUS
  Filled 2012-05-27 (×2): qty 250

## 2012-05-27 NOTE — Progress Notes (Signed)
ANTICOAGULATION CONSULT NOTE - Follow Up Consult  Pharmacy Consult for Heparin Indication: DVT/Bilateral PE   Allergies  Allergen Reactions  . Monosodium Glutamate     Headache and vision loss    Patient Measurements: Height: 5\' 7"  (170.2 cm) Weight: 252 lb 1.6 oz (114.352 kg) (standing scale) IBW/kg (Calculated) : 66.1  Heparin Dosing Weight: 80 kg  Vital Signs: Temp: 98.7 F (37.1 C) (08/22 0543) Temp src: Oral (08/22 0543) BP: 148/83 mmHg (08/22 0543) Pulse Rate: 85  (08/22 0543)  Labs:  Alvira Philips 05/27/12 0710 05/27/12 0445 05/27/12 0030 05/26/12 2108 05/26/12 2107 05/26/12 2040 05/24/12 1342  HGB -- 12.2* -- -- -- 13.9 --  HCT -- 36.5* -- -- -- 40.1 39.9  PLT -- 269 -- -- -- 295 184  APTT -- -- -- 35 -- -- --  LABPROT -- -- -- 13.4 -- -- --  INR -- -- -- 1.00 -- -- --  HEPARINUNFRC <0.10* -- 0.18* -- -- -- --  CREATININE -- 0.72 -- -- -- 0.65 0.68  CKTOTAL -- -- -- -- -- -- --  CKMB -- -- -- -- -- -- --  TROPONINI -- -- -- -- <0.30 -- --    Estimated Creatinine Clearance: 132 ml/min (by C-G formula based on Cr of 0.72).   Medications:  Scheduled:    . sodium chloride   Intravenous Once  . sodium chloride   Intravenous STAT  . amLODipine  10 mg Oral Daily  . aspirin  81 mg Oral Daily  . docusate sodium  100 mg Oral BID  . fentaNYL  50 mcg Intravenous Once  . fentaNYL  50 mcg Intravenous Once  . heparin  4,000 Units Intravenous Once  . insulin aspart  0-15 Units Subcutaneous TID WC  . insulin aspart  0-5 Units Subcutaneous QHS  . losartan  100 mg Oral QHS  . ondansetron (ZOFRAN) IV  4 mg Intravenous Once  . sodium chloride  3 mL Intravenous Q12H   Infusions:    . sodium chloride 75 mL/hr at 05/27/12 0347  . heparin 1,400 Units/hr (05/26/12 2317)    Assessment: 51 yo male with Stage IB high-grade B-cell non-Hodgkin lymphoma, diagnosed with a new DVT on 05/25/12 and started on Lovenox 100mg  sq q12h. Presents to [facility] ED 05/26/12 c/o chest discomfort  worse with deep breathing and found to have bilateral PEs. Oncology wanted to switch to full dose heparin.   Heparin level was undetectable this am. No bleeding reported in chart notes. No problems with the heparin line per personal line inspection and confirmed by RN (currently infusing at 1400 units/hr). Will increase heparin rate and recheck a level in 6 hours.  Goal of Therapy:  Heparin level 0.3-0.7 units/ml Monitor platelets by anticoagulation protocol: Yes   Plan:  1) Reboulus heparin 4000 units IV x1 now 2) Increase heparin drip rate to 1700 units/hr 3) Recheck heparin level in 6 hours (~ 15:00)  Darrol Angel, PharmD Pager: [phone] 05/27/2012,8:21 AM

## 2012-05-27 NOTE — Progress Notes (Signed)
[facility] PROGRESS NOTE  William Calderon ZOX:[id number] DOB: August 13, 1961 DOA: 05/26/2012 PCP: Lajean Saver, NT   Brief narrative: 51 y.o. male with hx of NonHodgkin lymphoma, finishing chemotherapy and will start radiation therapy, HTN, hyperlipidemia, anxiety, prior CVA, with recently diagnosed left lower extremity DVT days ago and sent home on therapeutic Lovenox from the clinic presents to ER with right sided chest pain with findings off bilateral lower lobe PE.   Assessment/Plan:  Principal Problem: Bilateral lower lobe PE with left lower extremity DVT Risk factor includes underlying malignancy. Patient had a left lower leg DVT diagnosed 2 days ago in the oncology clinic and was discharged on therapeutic Lovenox dose which she has been compliant with return next day with right-sided pleuritic chest pain symptoms. -Patient either had already developed a PE the initial presentation but was asymptomatic or there is migration of his lower extremity DVT developing into a PE. Patient was on anticoagulation for only 24 hours and this is unlikely that he is on a treatment failure with Lovenox. Patient had a Doppler of his bilateral extremities done on 8/ 20 -After discussing with the on-call oncologist Dr. Caron Presume he was placed on IV heparin drip. -Continue telemetry monitoring. Serial cardiac enzymes are negative. No EKG changes. Pending 2D ECHO to evaluate for right heart strain. -Patient's primary oncologist is Dr. Cyndie Chime who is away and Dr. Caron Presume will consult on the patient and provide recommendations regarding his anticoagulations. -Continue oxygen the nasal cannula and monitor O2 sat.   Hypertension Stable. Continue amlodipine and losartan  History of CAD and CVA Continue aspirin   Nodular High Grade B-Cell Lymphoma Follows with Dr. Cyndie Chime and is on chemotherapy   DM2 (diabetes mellitus, type 2) sliding scale insulin    Diet : diabetic Code Status: Full  code  Disposition Plan: Home likely in 1 to 2 days      Consultants:  Dr. Caron Presume and will evaluate the patient  Procedures:  None  Antibiotics:  None  HPI/Subjective: Still complains off right-sided pleuritic chest pain with deep inspiration.  Objective: Filed Vitals:   05/26/12 2019 05/26/12 2234 05/27/12 0317 05/27/12 0543  BP: 144/86  144/87 148/83  Pulse: 107  96 85  Temp: 99 F (37.2 C)  98.3 F (36.8 C) 98.7 F (37.1 C)  TempSrc: Oral  Oral Oral  Resp: 16  20 20   Height:  5\' 7"  (1.702 m) 5\' 7"  (1.702 m)   Weight:  112.946 kg (249 lb) 114.352 kg (252 lb 1.6 oz)   SpO2: 95%  93% 94%    Intake/Output Summary (Last 24 hours) at 05/27/12 1114 Last data filed at 05/27/12 0700  Gross per 24 hour  Intake 241.25 ml  Output    400 ml  Net -158.75 ml   Filed Weights   05/26/12 2234 05/27/12 0317  Weight: 112.946 kg (249 lb) 114.352 kg (252 lb 1.6 oz)    Exam:   General:  Middle-aged obese male lying in bed in no acute distress  HEENT: No pallor, no icterus, moist oral mucosa  Cardiovascular: Normal S1 and S2 no murmurs rub or gallop  Respiratory: Clear breath sounds bilaterally no added sounds  Abdomen: Soft, nontender, nondistended, bowel sounds present  Extremities: Mild tenderness over the left calf with tightness, no obvious swelling  CNS AAO x3  Data Reviewed: Basic Metabolic Panel:  Lab 05/27/12 8119 05/26/12 2040 05/24/12 1342  NA 135 134* 139  K 3.6 3.9 3.9  CL 99 98 101  CO2 24 22 25   GLUCOSE 177* 193* 192*  BUN 5* 6 7  CREATININE 0.72 0.65 0.68  CALCIUM 9.2 9.6 9.2  MG -- -- --  PHOS -- -- --   Liver Function Tests:  Lab 05/26/12 2040 05/24/12 1342  AST 19 12  ALT 19 21  ALKPHOS 65 63  BILITOT 0.8 0.9  PROT 7.0 6.5  ALBUMIN 3.7 4.2    Lab 05/26/12 2040  LIPASE 14  AMYLASE --   No results found for this basename: AMMONIA:5 in the last 168 hours CBC:  Lab 05/27/12 0445 05/26/12 2040 05/24/12 1342  WBC 4.6 4.1 2.9*   NEUTROABS -- 2.0 1.2*  HGB 12.2* 13.9 13.6  HCT 36.5* 40.1 39.9  MCV 88.2 86.8 88.1  PLT 269 295 184   Cardiac Enzymes:  Lab 05/26/12 2107  CKTOTAL --  CKMB --  CKMBINDEX --  TROPONINI <0.30   BNP (last 3 results) No results found for this basename: PROBNP:3 in the last 8760 hours CBG:  Lab 05/27/12 0759  GLUCAP 208*    No results found for this or any previous visit (from the past 240 hour(s)).   Studies: Ct Angio Chest Pe W/cm &/or Wo Cm  05/26/2012  *RADIOLOGY REPORT*  Clinical Data: Right-sided chest pain and shortness of breath.  No deep venous thrombosis.  CT ANGIOGRAPHY CHEST  Technique:  Multidetector CT imaging of the chest using the standard protocol during bolus administration of intravenous contrast. Multiplanar reconstructed images including MIPs were obtained and reviewed to evaluate the vascular anatomy.  Contrast: OMNIPAQUE IOHEXOL 350 MG/ML SOLN  Comparison: 02/16/2012  Findings: Technically adequate study with moderately good opacification of the central and proximal segmental pulmonary arteries.  Peripheral vessels are not well opacified.  Filling defects are demonstrated and distal segmental and subsegmental branch vessels to the lower lobes bilaterally consistent with pulmonary emboli.  No central emboli are demonstrated.  Normal heart size.  Normal caliber thoracic aorta.  Coronary artery calcification.  Postoperative changes in the mediastinum.  Left- sided central venous catheter with tip in the mid SVC.  Enlarged right axillary lymph nodes seen previously are incompletely included on the study but appear to have been resolved or resected in the interval.  No significant lymphadenopathy noted in the chest today.  Visualized portions of the upper abdomen demonstrate enlarged lateral segment of the left lobe of the liver suggesting possible cirrhosis.  Hepatic cyst is stable.  Surgical absence of the gallbladder.  Small right pleural effusion with basilar  atelectasis.  Motion artifact limits visualization of the lungs but there is no obvious lumbar consolidation.  No pneumothorax.  The airways appear patent. Degenerative changes in the thoracic spine.  No destructive bone lesions are appreciated.  IMPRESSION: Positive study for pulmonary embolus and bilateral lower lobe distal segmental and subsegmental vessels.  No central emboli. Small right pleural effusion with bilateral basilar atelectasis.  Critical Value/emergent results were called by telephone at the time of interpretation on 05/26/2012 at 2149 hours to Dr. Sharee Pimple, who verbally acknowledged these results.   Original Report Authenticated By: Marlon Pel, M.D.     Scheduled Meds:   . sodium chloride   Intravenous Once  . sodium chloride   Intravenous STAT  . amLODipine  10 mg Oral Daily  . aspirin  81 mg Oral Daily  . docusate sodium  100 mg Oral BID  . fentaNYL  50 mcg Intravenous Once  . fentaNYL  50 mcg Intravenous Once  .  heparin  4,000 Units Intravenous Once  . heparin  4,000 Units Intravenous Once  . insulin aspart  0-15 Units Subcutaneous TID WC  . insulin aspart  0-5 Units Subcutaneous QHS  . losartan  100 mg Oral QHS  . ondansetron (ZOFRAN) IV  4 mg Intravenous Once  . sodium chloride  3 mL Intravenous Q12H   Continuous Infusions:   . sodium chloride 75 mL/hr at 05/27/12 0347  . heparin 1,700 Units/hr (05/27/12 0914)      Time spent: 30 minutes    Octavian Godek  [facility] Pager [phone]. If 8PM-8AM, please contact night-coverage at [email or web address], password [facility] 05/27/2012, 11:14 AM  LOS: 1 day

## 2012-05-27 NOTE — Progress Notes (Signed)
   CARE MANAGEMENT NOTE 05/27/2012  Patient:  William Calderon, William Calderon   Account Number:  [id number]  Date Initiated:  05/27/2012  Documentation initiated by:  Jiles Crocker  Subjective/Objective Assessment:   ADMITTED WITH ACUTE PE     Action/Plan:   PCP:   Lajean Saver, NT  LIVES AT HOME WITH SPOUSE   Anticipated DC Date:  06/03/2012   Anticipated DC Plan:  HOME/SELF CARE      DC Planning Services  CM consult       Status of service:  In process, will continue to follow Medicare Important Message given?  NA - LOS <3 / Initial given by admissions (If response is "NO", the following Medicare IM given date fields will be blank)  Per UR Regulation:  Reviewed for med. necessity/level of care/duration of stay  Comments:  05/27/2012- B Adisen Bennion RN, BSN, MHA
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
## 2012-05-27 NOTE — Progress Notes (Signed)
Spoke with Tess, RN re new order for bilateral lower extremity venous duplex.  I did bilateral venous on 05-27-12, which is our protocol for positive studies.  No DVT noted in the right lower extremity but DVT was found on the left.  Tess said she would let the doctor know.

## 2012-05-27 NOTE — Progress Notes (Addendum)
PHARMACY BRIEF NOTE - ANTICOAGULATION  The current heparin infusion rate is 1700 units/hr.  The level drawn 6 hours after the last rate increase is reported as < 0.1 units/ml.  The patient's nurse reports that there has been no interruption of the infusion.  Goal:  Heparin level 0.3-0.7 units/ml  Plan: Bolus 4000 units  Increase infusion rate to 2300 units/hr   Repeat level in 6 hours.  Polo Riley R.Ph. 05/27/2012 4:11 PM  Addendum:  Assessment:  HL <0.10 units/ml again!!  Asked RN to check IV site and look for leaks ect.    States arm looks a little "puffy"- I asked her to start a new line for the heparin.  Plan:   Per IV team site is OK.  Rebolus with 4000 units of heparin.  Continue heparin @ 2300 units/hr.  Recheck level in 6 hours.  Lorenza Evangelist 05/27/2012 10:44 PM  Assessment:    HL <0.10 units/ml again!  Per RN IV site is fine/no IV interuptions/no bleeding.  Plan:    Rebolus w/ 4000 units iv x1 then increase drip to 3000 units/hr.  Recheck level in 6 hours.  Lorenza Evangelist 05/28/2012 5:40 AM

## 2012-05-27 NOTE — Progress Notes (Signed)
*  PRELIMINARY RESULTS* Echocardiogram 2D Echocardiogram has been performed.  William Calderon 05/27/2012, 2:44 PM

## 2012-05-27 NOTE — Progress Notes (Signed)
Nutrition Brief Note  Patient identified on the Malnutrition Screening Tool (MST) report for unintentional weight loss, generating a score of 2.   Body mass index is 39.48 kg/(m^2). Pt meets criteria for obesity class 3  based on current BMI.   Current diet order is Carb Modified, patient is consuming approximately 90% of meals at this time. Labs and medications reviewed. Patient reported he has been trying to loose some weight. Patient is without any nutrition related questions or concerns.   No nutrition interventions warranted at this time. If nutrition issues arise, please consult RD.   William Calderon [facility] [phone]

## 2012-05-28 ENCOUNTER — Other Ambulatory Visit (HOSPITAL_COMMUNITY): Payer: Medicaid Other

## 2012-05-28 DIAGNOSIS — I824Z9 Acute embolism and thrombosis of unspecified deep veins of unspecified distal lower extremity: Secondary | ICD-10-CM

## 2012-05-28 DIAGNOSIS — I10 Essential (primary) hypertension: Secondary | ICD-10-CM

## 2012-05-28 LAB — CBC
MCH: 29.7 pg (ref 26.0–34.0)
MCHC: 34 g/dL (ref 30.0–36.0)
MCV: 87.1 fL (ref 78.0–100.0)
Platelets: 268 10*3/uL (ref 150–400)
RDW: 14 % (ref 11.5–15.5)

## 2012-05-28 LAB — HEPARIN LEVEL (UNFRACTIONATED): Heparin Unfractionated: 0.13 IU/mL — ABNORMAL LOW (ref 0.30–0.70)

## 2012-05-28 LAB — ANTITHROMBIN III: AntiThromb III Func: 33 % — ABNORMAL LOW (ref 75–120)

## 2012-05-28 MED ORDER — RIVAROXABAN 15 MG PO TABS
15.0000 mg | ORAL_TABLET | Freq: Two times a day (BID) | ORAL | Status: DC
  Administered 2012-05-28 – 2012-05-29 (×2): 15 mg via ORAL
  Filled 2012-05-28 (×4): qty 1

## 2012-05-28 MED ORDER — HEPARIN BOLUS VIA INFUSION
4000.0000 [IU] | Freq: Once | INTRAVENOUS | Status: AC
  Administered 2012-05-28: 4000 [IU] via INTRAVENOUS
  Filled 2012-05-28: qty 4000

## 2012-05-28 MED ORDER — HEPARIN (PORCINE) IN NACL 100-0.45 UNIT/ML-% IJ SOLN
3750.0000 [IU]/h | INTRAMUSCULAR | Status: DC
  Administered 2012-05-28: 3750 [IU]/h via INTRAVENOUS
  Filled 2012-05-28 (×3): qty 250

## 2012-05-28 MED ORDER — HEPARIN (PORCINE) IN NACL 100-0.45 UNIT/ML-% IJ SOLN
3000.0000 [IU]/h | INTRAMUSCULAR | Status: DC
  Administered 2012-05-28: 3000 [IU]/h via INTRAVENOUS
  Filled 2012-05-28 (×3): qty 250

## 2012-05-28 NOTE — Progress Notes (Addendum)
ANTICOAGULATION CONSULT NOTE - Follow Up Consult  Pharmacy Consult for:  Xarelto Indication:  Treatment of DVT and bilateral PE  Allergies  Allergen Reactions  . Monosodium Glutamate     Headache and vision loss    Patient Measurements: Height: 5\' 7"  (170.2 cm) Weight: 246 lb 3.2 oz (111.676 kg) (standing scale) IBW/kg (Calculated) : 66.1   Vital Signs: Temp: 98.3 F (36.8 C) (08/23 1456) Temp src: Oral (08/23 1456) BP: 134/81 mmHg (08/23 1456) Pulse Rate: 86  (08/23 1456)  Labs:  Alvira Philips 05/28/12 1235 05/28/12 0423 05/27/12 2135 05/27/12 0445 05/26/12 2108 05/26/12 2107 05/26/12 2040  HGB -- 11.3* -- 12.2* -- -- --  HCT -- 33.2* -- 36.5* -- -- 40.1  PLT -- 268 -- 269 -- -- 295  APTT -- -- -- -- 35 -- --  LABPROT -- -- -- -- 13.4 -- --  INR -- -- -- -- 1.00 -- --  HEPARINUNFRC 0.13* <0.10* <0.10* -- -- -- --  CREATININE -- -- -- 0.72 -- -- 0.65  CKTOTAL -- -- -- -- -- -- --  CKMB -- -- -- -- -- -- --  TROPONINI -- -- -- -- -- <0.30 --    Estimated Creatinine Clearance: 130.3 ml/min (by C-G formula based on Cr of 0.72).  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  Discussed with Dr. Myna Hidalgo and received orders to discontinue the heparin infusion and begin Xarelto.  Goals of Therapy:   Resolution of DVT and PE  Prevention of recurrence of VTE  Monitor for adequate renal function and drug interactions   Plan:  Xarelto 15 mg twice daily with meals for 21 days, then 20 mg once daily with the evening meal thereafter.  Polo Riley R.Ph. 05/28/2012 9:38 PM

## 2012-05-28 NOTE — Progress Notes (Signed)
William Calderon   DOB:1961-02-19   ZO#:[id number]   JWJ#:[id number]  Subjective: 51 year old with history of stage I B. high-grade non-Hodgkin's lymphoma with recent history of DVT on Lovenox. Patient was admitted 24 hours after starting Lovenox with chest pain acute onset and was found to have bilateral pulmonary emboli. Decision was made to admit him and to  receive IV heparin without bolus  Objective:  He is doing somewhat better. His chest pain is resolved he continues to have discomfort in his calf. He denies a bleeding or bruising. Filed Vitals:   05/28/12 0624  BP: 108/65  Pulse: 88  Temp: 98 F (36.7 C)  Resp: 20    Body mass index is 38.91 kg/(m^2).  Intake/Output Summary (Last 24 hours) at 05/28/12 0828 Last data filed at 05/28/12 6213  Gross per 24 hour  Intake    600 ml  Output    400 ml  Net    200 ml     Sclerae unicteric  Oropharynx clear  No peripheral adenopathy  Lungs clear -- no rales or rhonchi  Heart regular rate and rhythm  Abdomen benign  MSK no focal spinal tenderness, no peripheral edema, left leg calf tenderness  Neuro nonfocal    CBG (last 3)   Basename 05/28/12 0813 05/27/12 2055 05/27/12 1723  GLUCAP 160* 140* 152*     Labs:  Lab Results  Component Value Date   WBC 5.2 05/28/2012   HGB 11.3* 05/28/2012   HCT 33.2* 05/28/2012   MCV 87.1 05/28/2012   PLT 268 05/28/2012   NEUTROABS 2.0 05/26/2012    Urine Studies No results found for this basename: UACOL:2,UAPR:2,USPG:2,UPH:2,UTP:2,UGL:2,UKET:2,UBIL:2,UHGB:2,UNIT:2,UROB:2,ULEU:2,UEPI:2,UWBC:2,URBC:2,UBAC:2,CAST:2,CRYS:2,UCOM:2,BILUA:2 in the last 72 hours  Basic Metabolic Panel:  Lab 05/27/12 0865 05/26/12 2040 05/24/12 1342  NA 135 134* 139  K 3.6 3.9 --  CL 99 98 101  CO2 24 22 25   GLUCOSE 177* 193* 192*  BUN 5* 6 7  CREATININE 0.72 0.65 0.68  CALCIUM 9.2 9.6 9.2  MG -- -- --  PHOS -- -- --   GFR Estimated Creatinine Clearance: 130.9 ml/min (by C-G formula based on Cr of  0.72). Liver Function Tests:  Lab 05/26/12 2040 05/24/12 1342  AST 19 12  ALT 19 21  ALKPHOS 65 63  BILITOT 0.8 0.9  PROT 7.0 6.5  ALBUMIN 3.7 4.2    Lab 05/26/12 2040  LIPASE 14  AMYLASE --   No results found for this basename: AMMONIA:5 in the last 168 hours Coagulation profile  Lab 05/26/12 2108  INR 1.00  PROTIME --    CBC:  Lab 05/28/12 0423 05/27/12 0445 05/26/12 2040 05/24/12 1342  WBC 5.2 4.6 4.1 2.9*  NEUTROABS -- -- 2.0 1.2*  HGB 11.3* 12.2* 13.9 13.6  HCT 33.2* 36.5* 40.1 39.9  MCV 87.1 88.2 86.8 88.1  PLT 268 269 295 184   Cardiac Enzymes:  Lab 05/26/12 2107  CKTOTAL --  CKMB --  CKMBINDEX --  TROPONINI <0.30   BNP: No components found with this basename: POCBNP:5 CBG:  Lab 05/28/12 0813 05/27/12 2055 05/27/12 1723 05/27/12 1307 05/27/12 0759  GLUCAP 160* 140* 152* 201* 208*   D-Dimer No results found for this basename: DDIMER:2 in the last 72 hours Hgb A1c No results found for this basename: HGBA1C:2 in the last 72 hours Lipid Profile No results found for this basename: CHOL:2,HDL:2,LDLCALC:2,TRIG:2,CHOLHDL:2,LDLDIRECT:2 in the last 72 hours Thyroid function studies  Basename 05/27/12 0445  TSH 1.739  T4TOTAL --  T3FREE --  THYROIDAB --  Anemia work up No results found for this basename: VITAMINB12:2,FOLATE:2,FERRITIN:2,TIBC:2,IRON:2,RETICCTPCT:2 in the last 72 hours Microbiology No results found for this or any previous visit (from the past 240 hour(s)).    Studies:  Ct Angio Chest Pe W/cm &/or Wo Cm  05/26/2012  *RADIOLOGY REPORT*  Clinical Data: Right-sided chest pain and shortness of breath.  No deep venous thrombosis.  CT ANGIOGRAPHY CHEST  Technique:  Multidetector CT imaging of the chest using the standard protocol during bolus administration of intravenous contrast. Multiplanar reconstructed images including MIPs were obtained and reviewed to evaluate the vascular anatomy.  Contrast: OMNIPAQUE IOHEXOL 350 MG/ML SOLN   Comparison: 02/16/2012  Findings: Technically adequate study with moderately good opacification of the central and proximal segmental pulmonary arteries.  Peripheral vessels are not well opacified.  Filling defects are demonstrated and distal segmental and subsegmental branch vessels to the lower lobes bilaterally consistent with pulmonary emboli.  No central emboli are demonstrated.  Normal heart size.  Normal caliber thoracic aorta.  Coronary artery calcification.  Postoperative changes in the mediastinum.  Left- sided central venous catheter with tip in the mid SVC.  Enlarged right axillary lymph nodes seen previously are incompletely included on the study but appear to have been resolved or resected in the interval.  No significant lymphadenopathy noted in the chest today.  Visualized portions of the upper abdomen demonstrate enlarged lateral segment of the left lobe of the liver suggesting possible cirrhosis.  Hepatic cyst is stable.  Surgical absence of the gallbladder.  Small right pleural effusion with basilar atelectasis.  Motion artifact limits visualization of the lungs but there is no obvious lumbar consolidation.  No pneumothorax.  The airways appear patent. Degenerative changes in the thoracic spine.  No destructive bone lesions are appreciated.  IMPRESSION: Positive study for pulmonary embolus and bilateral lower lobe distal segmental and subsegmental vessels.  No central emboli. Small right pleural effusion with bilateral basilar atelectasis.  Critical Value/emergent results were called by telephone at the time of interpretation on 05/26/2012 at 2149 hours to Dr. Sharee Pimple, who verbally acknowledged these results.   Original Report Authenticated By: Marlon Pel, M.D.     Assessment: 52 y.o.    Plan: History of NHL with history of ischemic heart disease, diabetes and previous stroke. I would currently recommend that he receive IV heparin for at least 72 hours with subsequent conversion 2 of  oral Coumadin at the same time. Of note is that he is current heparin levels are subtherapeutic at. He will need to have become therapeutic and start Coumadin at the same time. I do not believe this represents a failure of anticoagulation and was likely a coincidence than that likely he did develop a concurrent of pulmonary emboli with his DVT , which did not have a chance to be treated by Lovenox.   Nohely Whitehorn 05/28/2012

## 2012-05-28 NOTE — Plan of Care (Signed)
Problem: Phase II Progression Outcomes Goal: Therapeutic drug levels for anticoagulation Outcome: Not Progressing Currently on iv heparin gtt.

## 2012-05-28 NOTE — Progress Notes (Addendum)
ANTICOAGULATION CONSULT NOTE - Follow Up Consult  Pharmacy Consult for Heparin/Coumadin Indication: DVT/Bilateral Acute PE  Allergies  Allergen Reactions  . Monosodium Glutamate     Headache and vision loss    Patient Measurements: Height: 5\' 7"  (170.2 cm) Weight: 246 lb 3.2 oz (111.676 kg) (standing scale) IBW/kg (Calculated) : 66.1  Heparin Dosing Weight: 91kg  Vital Signs: Temp: 98 F (36.7 C) (08/23 0624) Temp src: Oral (08/23 0624) BP: 108/65 mmHg (08/23 0624) Pulse Rate: 88  (08/23 0624)  Labs:  Alvira Philips 05/28/12 1235 05/28/12 0423 05/27/12 2135 05/27/12 0445 05/26/12 2108 05/26/12 2107 05/26/12 2040  HGB -- 11.3* -- 12.2* -- -- --  HCT -- 33.2* -- 36.5* -- -- 40.1  PLT -- 268 -- 269 -- -- 295  APTT -- -- -- -- 35 -- --  LABPROT -- -- -- -- 13.4 -- --  INR -- -- -- -- 1.00 -- --  HEPARINUNFRC 0.13* <0.10* <0.10* -- -- -- --  CREATININE -- -- -- 0.72 -- -- 0.65  CKTOTAL -- -- -- -- -- -- --  CKMB -- -- -- -- -- -- --  TROPONINI -- -- -- -- -- <0.30 --    Estimated Creatinine Clearance: 130.3 ml/min (by C-G formula based on Cr of 0.72).   Medications:  Infusions:    . sodium chloride 75 mL/hr at 05/27/12 1319  . heparin    . DISCONTD: heparin 1,700 Units/hr (05/27/12 0914)  . DISCONTD: heparin 2,300 Units/hr (05/27/12 2104)  . DISCONTD: heparin 3,000 Units/hr (05/28/12 0610)    Assessment:  50 YOM w/ hx NHL who was diagnosed w/ LE DVT outpatient at [facility] on 8/20. He was started on Lovenox 100mg  sq q12h.  Pt presented to [facility] ER 8/21 w/ chest pain and SOB, was found to have acute PE. Per pt report, had 2 injections of Lovenox prior to presentation w/sx of PE.   Heparin infusion started 8/21 ~2300. Heparin levels have remained subtherapeutic despite rebolusing and increased rate of infusion. No bleeding reported. H/H low, Pltc wnl.  Current HL is 0.13 on 3000 units/hour of heparin; RN reports no problems or interruptions w/IV site or infusion  Goal of  Therapy:  Heparin level 0.3-0.7 units/ml Monitor platelets by anticoagulation protocol: Yes   Plan:   Antithrombin III level has been ordered - spoke w both Dr Donnie Coffin and Dr Truett Perna re: suspicion of ATIII deficiency  Re-bolus Heparin 4000 units  Increase Heparin to 3750 units/hour  Recheck HL at 2100  Daily HL and CBC  Coumadin was ordered to start today but in light of above, Dr Donnie Coffin has d/c Coumadin for now (may need direct thrombin inhibitor like Xarelto per Dr Truett Perna)  Will notify heme/onc about ATIII results.  Gwen Her PharmD  [phone] 05/28/2012 2:14 PM

## 2012-05-28 NOTE — Progress Notes (Signed)
[facility] PROGRESS NOTE  William Calderon ZOX:[id number] DOB: 12/30/1960 DOA: 05/26/2012 PCP: Lajean Saver, NT   Brief narrative: 51 y.o. male with hx of NonHodgkin lymphoma, finishing chemotherapy and will start radiation therapy, HTN, hyperlipidemia, anxiety, prior CVA, with recently diagnosed left lower extremity DVT days ago and sent home on therapeutic Lovenox from the clinic presents to ER with right sided chest pain with findings off bilateral lower lobe PE.   Assessment/Plan:  Principal Problem: Bilateral lower lobe PE with left lower extremity DVT Risk factor includes underlying malignancy. Patient had a left lower leg DVT diagnosed 2 days prior to admission in the oncology clinic and was discharged on therapeutic Lovenox dose which he has been compliant and took 2 doses with return next day with right-sided pleuritic chest pain symptoms. -Patient either had already developed a PE the initial presentation but was asymptomatic or there is migration of his lower extremity DVT developing into a PE. Patient was on anticoagulation for only 24 hours and this is unlikely for treatment failure with Lovenox. Patient had a Doppler of his bilateral extremities done on 8/ 20 -Patient continues on IV heparin drip and anticoagulation management is as per the hematologist. Despite high doses of IV heparin patient's heparin levels have been subtherapeutic. -Continue telemetry monitoring. Serial cardiac enzymes are negative. No EKG changes. 2-D echo does not show right heart strain. -Patient's primary oncologist is Dr. Cyndie Chime who is away and Dr. Caron Presume is consult in and providing recommendations regarding his anticoagulations. -Continue oxygen the nasal cannula and monitor O2 sat.   Hypertension Stable. Continue amlodipine and losartan  History of CAD and CVA Continue aspirin. Asymptomatic.   Nodular High Grade B-Cell Lymphoma Follows with Dr. Cyndie Chime and is on chemotherapy   DM2 (diabetes mellitus, type 2) sliding scale insulin. Reasonable inpatient control.  Anemia: Follow CBCs daily. No evidence of bleeding.    Diet : diabetic Code Status: Full code  Disposition Plan: To be determined.   Discussed with patient's mother at the bedside and updated care, at patient's request (05/28/12)  Consultants:  Dr. Caron Presume   Procedures:  None  Antibiotics:  None  HPI/Subjective: Patient is asking when he can go home. Denies chest pain, dyspnea, dizziness or lightheadedness. Mild pain and swelling in left calf.  Objective: Filed Vitals:   05/28/12 0500 05/28/12 0624 05/28/12 1155 05/28/12 1456  BP:  108/65  134/81  Pulse:  88  86  Temp:  98 F (36.7 C)  98.3 F (36.8 C)  TempSrc:  Oral  Oral  Resp:  20  20  Height:      Weight: 112.7 kg (248 lb 7.3 oz)  111.676 kg (246 lb 3.2 oz)   SpO2:  90%  92%    Intake/Output Summary (Last 24 hours) at 05/28/12 1736 Last data filed at 05/28/12 1500  Gross per 24 hour  Intake 2563.63 ml  Output    400 ml  Net 2163.63 ml   Filed Weights   05/27/12 0317 05/28/12 0500 05/28/12 1155  Weight: 114.352 kg (252 lb 1.6 oz) 112.7 kg (248 lb 7.3 oz) 111.676 kg (246 lb 3.2 oz)    Exam:   General:  Middle-aged obese male lying in bed in no acute distress  HEENT: No pallor, no icterus, moist oral mucosa  Cardiovascular: Normal S1 and S2 no murmurs rub or gallop. Telemetry shows sinus rhythm with PVCs and occasional trigeminy.  Respiratory: Clear breath sounds bilaterally no added sounds. No increased work of breathing.  Abdomen: Soft, nontender, nondistended, bowel sounds present  Extremities: Mild tenderness over the left calf with tightness, no obvious swelling. Peripheral pulses are symmetrically felt.  CNS AAO x3. No focal deficits.  Data Reviewed: Basic Metabolic Panel:  Lab 05/27/12 9147 05/26/12 2040 05/24/12 1342  NA 135 134* 139  K 3.6 3.9 3.9  CL 99 98 101  CO2 24 22 25   GLUCOSE 177* 193*  192*  BUN 5* 6 7  CREATININE 0.72 0.65 0.68  CALCIUM 9.2 9.6 9.2  MG -- -- --  PHOS -- -- --   Liver Function Tests:  Lab 05/26/12 2040 05/24/12 1342  AST 19 12  ALT 19 21  ALKPHOS 65 63  BILITOT 0.8 0.9  PROT 7.0 6.5  ALBUMIN 3.7 4.2    Lab 05/26/12 2040  LIPASE 14  AMYLASE --   No results found for this basename: AMMONIA:5 in the last 168 hours CBC:  Lab 05/28/12 0423 05/27/12 0445 05/26/12 2040 05/24/12 1342  WBC 5.2 4.6 4.1 2.9*  NEUTROABS -- -- 2.0 1.2*  HGB 11.3* 12.2* 13.9 13.6  HCT 33.2* 36.5* 40.1 39.9  MCV 87.1 88.2 86.8 88.1  PLT 268 269 295 184   Cardiac Enzymes:  Lab 05/26/12 2107  CKTOTAL --  CKMB --  CKMBINDEX --  TROPONINI <0.30   BNP (last 3 results) No results found for this basename: PROBNP:3 in the last 8760 hours CBG:  Lab 05/28/12 1149 05/28/12 0813 05/27/12 2055 05/27/12 1723 05/27/12 1307  GLUCAP 163* 160* 140* 152* 201*    No results found for this or any previous visit (from the past 240 hour(s)).   Studies: Ct Angio Chest Pe W/cm &/or Wo Cm  05/26/2012  *RADIOLOGY REPORT*  Clinical Data: Right-sided chest pain and shortness of breath.  No deep venous thrombosis.  CT ANGIOGRAPHY CHEST  Technique:  Multidetector CT imaging of the chest using the standard protocol during bolus administration of intravenous contrast. Multiplanar reconstructed images including MIPs were obtained and reviewed to evaluate the vascular anatomy.  Contrast: OMNIPAQUE IOHEXOL 350 MG/ML SOLN  Comparison: 02/16/2012  Findings: Technically adequate study with moderately good opacification of the central and proximal segmental pulmonary arteries.  Peripheral vessels are not well opacified.  Filling defects are demonstrated and distal segmental and subsegmental branch vessels to the lower lobes bilaterally consistent with pulmonary emboli.  No central emboli are demonstrated.  Normal heart size.  Normal caliber thoracic aorta.  Coronary artery calcification.   Postoperative changes in the mediastinum.  Left- sided central venous catheter with tip in the mid SVC.  Enlarged right axillary lymph nodes seen previously are incompletely included on the study but appear to have been resolved or resected in the interval.  No significant lymphadenopathy noted in the chest today.  Visualized portions of the upper abdomen demonstrate enlarged lateral segment of the left lobe of the liver suggesting possible cirrhosis.  Hepatic cyst is stable.  Surgical absence of the gallbladder.  Small right pleural effusion with basilar atelectasis.  Motion artifact limits visualization of the lungs but there is no obvious lumbar consolidation.  No pneumothorax.  The airways appear patent. Degenerative changes in the thoracic spine.  No destructive bone lesions are appreciated.  IMPRESSION: Positive study for pulmonary embolus and bilateral lower lobe distal segmental and subsegmental vessels.  No central emboli. Small right pleural effusion with bilateral basilar atelectasis.  Critical Value/emergent results were called by telephone at the time of interpretation on 05/26/2012 at 2149 hours to Dr.  Sharee Pimple, who verbally acknowledged these results.   Original Report Authenticated By: Marlon Pel, M.D.    2-D echo:  Study Conclusions  - Left ventricle: The cavity size was normal. Wall thickness was increased in a pattern of mild LVH. Systolic function was normal. The estimated ejection fraction was in the range of 50% to 55%. Regional wall motion abnormalities cannot be excluded. Doppler parameters are consistent with abnormal left ventricular relaxation (grade 1 diastolic dysfunction). - Mitral valve: Mild regurgitation. - Left atrium: The atrium was mildly dilated.       Scheduled Meds:    . amLODipine  10 mg Oral Daily  . aspirin  81 mg Oral Daily  . docusate sodium  100 mg Oral BID  . heparin  4,000 Units Intravenous Once  . heparin  4,000 Units Intravenous Once    . heparin  4,000 Units Intravenous Once  . insulin aspart  0-15 Units Subcutaneous TID WC  . insulin aspart  0-5 Units Subcutaneous QHS  . losartan  100 mg Oral QHS  . sodium chloride  3 mL Intravenous Q12H   Continuous Infusions:    . sodium chloride 75 mL/hr at 05/27/12 1319  . heparin 3,750 Units/hr (05/28/12 1431)  . DISCONTD: heparin 2,300 Units/hr (05/27/12 2104)  . DISCONTD: heparin 3,000 Units/hr (05/28/12 0610)      Time spent: 30 minutes    [facility]  [facility] Pager [phone]. If 8PM-8AM, please contact night-coverage at [email or web address], password [facility] 05/28/2012, 5:36 PM  LOS: 2 days

## 2012-05-29 DIAGNOSIS — I472 Ventricular tachycardia, unspecified: Secondary | ICD-10-CM

## 2012-05-29 DIAGNOSIS — D6859 Other primary thrombophilia: Secondary | ICD-10-CM | POA: Diagnosis present

## 2012-05-29 LAB — MAGNESIUM: Magnesium: 1.9 mg/dL (ref 1.5–2.5)

## 2012-05-29 LAB — CBC
HCT: 33.2 % — ABNORMAL LOW (ref 39.0–52.0)
MCV: 86.9 fL (ref 78.0–100.0)
RDW: 14.2 % (ref 11.5–15.5)
WBC: 6.9 10*3/uL (ref 4.0–10.5)

## 2012-05-29 MED ORDER — RIVAROXABAN 20 MG PO TABS: 20.0000 mg | ORAL_TABLET | Freq: Every day | ORAL | Status: DC

## 2012-05-29 MED ORDER — METOPROLOL TARTRATE 12.5 MG HALF TABLET: 12.5000 mg | ORAL_TABLET | Freq: Two times a day (BID) | ORAL | Status: DC

## 2012-05-29 MED ORDER — RIVAROXABAN 15 MG PO TABS: 15.0000 mg | ORAL_TABLET | Freq: Two times a day (BID) | ORAL | Status: DC

## 2012-05-29 MED ORDER — METOPROLOL TARTRATE 12.5 MG HALF TABLET
12.5000 mg | ORAL_TABLET | Freq: Two times a day (BID) | ORAL | Status: DC
  Administered 2012-05-29: 12.5 mg via ORAL
  Filled 2012-05-29 (×2): qty 1

## 2012-05-29 MED ORDER — POTASSIUM CHLORIDE CRYS ER 20 MEQ PO TBCR
60.0000 meq | EXTENDED_RELEASE_TABLET | Freq: Once | ORAL | Status: AC
  Administered 2012-05-29: 60 meq via ORAL
  Filled 2012-05-29: qty 3

## 2012-05-29 NOTE — Discharge Summary (Signed)
Physician Discharge Summary  [name]:[id number] DOB: [birth year] DOA: 05/26/2012  PCP: Lajean Saver, NT  Primary Oncologist: Cephas Darby, MD  Admit date: 05/26/2012 Discharge date: 05/29/2012  Recommendations for Outpatient Follow-up:  1. Followup with Dr. Cephas Darby in 5 days from hospital discharge.  Discharge Diagnoses:  Principal Problem:  *Pulmonary embolism and infarction Active Problems:  Axillary mass - right 5.7 x 5.4 x 3.5 cm  Hypertension  Tobacco abuse  Nodular High Grade B-Cell Lymphoma  DM2 (diabetes mellitus, type 2)  DVT of leg (deep venous thrombosis)  Antithrombin III deficiency  NSVT (nonsustained ventricular tachycardia)   Discharge Condition: Stable  Diet recommendation: Heart healthy and diabetic  Filed Weights   05/28/12 0500 05/28/12 1155 05/29/12 0500  Weight: 112.7 kg (248 lb 7.3 oz) 111.676 kg (246 lb 3.2 oz) 111.3 kg (245 lb 6 oz)    History of present illness:  51 y.o. male with hx of NonHodgkin lymphoma, HTN, hyperlipidemia, anxiety, prior CVA, was diagnosed w/ LLE DVT outpatient at [facility] on 8/20. He was started on Lovenox 100mg  sq q12h. Pt presented to [facility] ER 8/21 w/ chest pain and SOB, was found to have acute PE. Per pt report, had 2 injections of Lovenox prior to presentation w/sx of PE.   Hospital Course:   Bilateral lower lobe PE with left lower extremity DVT  Risk factor includes underlying malignancy. Patient had a left lower leg DVT diagnosed 2 days prior to admission in the oncology clinic and was discharged on therapeutic Lovenox dose which he has been compliant and took 2 doses with return next day with right-sided pleuritic chest pain symptoms.  -Patient either had already developed a PE the initial presentation but was asymptomatic or there is migration of his lower extremity DVT developing into a PE. Patient was on anticoagulation for only 24 hours and this is unlikely to be treatment failure with Lovenox.  Patient had a Doppler of his bilateral extremities done on 8/ 20  -Heparin infusion was started. Heparin levels however remained subtherapeutic despite rebolusing and increased rate of infusion. Oncology were consulted and managing patient's anticoagulation. At this time antithrombin 3 levels were drawn and found to be low. Heparin was discontinued and patient was started on Xarelto of which she has received 2 doses thus far. Patient is asymptomatic of chest pain or dyspnea. He has minimal intermittent left calf pain. Discussed with Dr. Arbutus Ped this morning who has cleared him for discharge. -Serial cardiac enzymes are negative. No EKG changes. 2-D echo does not show right heart strain.   Hypertension  Stable. Continue amlodipine and losartan   History of CAD and CVA  Continue aspirin. Asymptomatic.   Nodular High Grade B-Cell Lymphoma  Follows with Dr. Cyndie Chime and is on chemotherapy   DM2 (diabetes mellitus, type 2)  Reasonable inpatient control. Continue metformin on discharge.  Anemia:  Stable. No evidence of bleeding.   NSVT x1 episode Patient had an episode of 6 beat NSVT at approximately 2:40 AM which was asymptomatic. 2-D echo shows normal EF, no RV strain and regional wall motion abnormalities could not be appreciated. Advised that he could be monitored for additional day on telemetry and consider further evaluation if he had further episodes but patient declines that and insists on going home at this time. This was discussed with his mother at the bedside too. Will check potassium and magnesium and start on low dose metoprolol.   Procedures:  None  Consultations:  Hematology/oncology  Discharge Exam:  Complaints: Denies chest pain, dyspnea, palpitations, dizziness or lightheadedness. Minimal pain in the left calf which has improved significantly compared to admission. Patient is tearful and eager to go home.   Filed Vitals:   05/29/12 0519  BP: 123/70  Pulse: 80    Temp: 98 F (36.7 C)  Resp: 20   Filed Vitals:   05/28/12 1456 05/28/12 2135 05/29/12 0500 05/29/12 0519  BP: 134/81 131/84  123/70  Pulse: 86 87  80  Temp: 98.3 F (36.8 C) 97.8 F (36.6 C)  98 F (36.7 C)  TempSrc: Oral Oral  Oral  Resp: 20 18  20   Height:      Weight:   111.3 kg (245 lb 6 oz)   SpO2: 92% 93%  93%    General: Middle-aged obese male lying in bed in no acute distress  HEENT: No pallor, no icterus, moist oral mucosa  Cardiovascular: Normal S1 and S2 no murmurs rub or gallop. Telemetry shows sinus rhythm with PVCs and occasional trigeminy. 6 beat NSVT at approximately 2:40 AM Respiratory: Clear breath sounds bilaterally no added sounds. No increased work of breathing.  Abdomen: Soft, nontender, nondistended, bowel sounds present  Extremities: Mild swelling of left calf but without any other acute signs. Peripheral pulses are symmetrically felt.  CNS AAO x3. No focal deficits.  Discharge Instructions  Discharge Orders    Future Appointments: Provider: Department: Dept Phone: Center:   05/31/2012 9:45 AM Sherrie Mustache [facility] [phone] None   05/31/2012 10:15 AM Rana Snare, NP [facility] [phone] None   06/02/2012 11:00 AM Lonie Peak, MD [facility] [phone] None     Joint Appt [facility] Sim 1 [facility] [phone] None     Future Orders Please Complete By Expires   Diet - low sodium heart healthy      Diet Carb Modified      Increase activity slowly      Call MD for:  severe uncontrolled pain      Call MD for:  difficulty breathing, headache or visual disturbances      Call MD for:  persistant dizziness or light-headedness      Call MD for:  extreme fatigue        Medication List  As of 05/29/2012 12:45 PM   STOP taking these medications         enoxaparin 100 MG/ML injection         TAKE these medications         ALPRAZolam 0.5 MG tablet   Commonly known as: XANAX   Take 0.25-0.5 mg by  mouth 2 (two) times daily as needed.      amLODipine 10 MG tablet   Commonly known as: NORVASC   Take 10 mg by mouth daily.      aspirin 81 MG chewable tablet   Chew 81 mg by mouth daily.      fluconazole 100 MG tablet   Commonly known as: DIFLUCAN   Take 2 tabs po x1 then one daily.  This  Script was given to pt 05/04/12      hydrocortisone 25 MG suppository   Commonly known as: ANUSOL-HC   Use one supp. PR twice a day x 72 hours post chemo.  This script given to pt 05/04/12      losartan 100 MG tablet   Commonly known as: COZAAR   Take 100 mg by mouth at bedtime.      metFORMIN 500 MG tablet  Commonly known as: GLUCOPHAGE   Take 500 mg by mouth 2 (two) times daily with a meal.      metoprolol tartrate 12.5 mg Tabs   Commonly known as: LOPRESSOR   Take 0.5 tablets (12.5 mg total) by mouth 2 (two) times daily.      OMEGA-3 KRILL OIL PO   Take 1 capsule by mouth daily.      oxyCODONE 5 MG immediate release tablet   Commonly known as: Oxy IR/ROXICODONE   1-2 po q 4h prn pain or severe headache.      predniSONE 20 MG tablet   Commonly known as: DELTASONE   3 tabs =60 mg po after breakfast & 2 tabs =40 mg po after lunch for 5 days of each chemo schedule      Rivaroxaban 15 MG Tabs tablet   Commonly known as: XARELTO   Take 1 tablet (15 mg total) by mouth 2 (two) times daily with a meal. Take for 40 tablets, then start 20 mg once daily (separate prescription) with the evening meal thereafter.      Rivaroxaban 20 MG Tabs   Commonly known as: XARELTO   Take 1 tablet (20 mg total) by mouth daily. Take with the evening meal. Start this prescription after you have completed the 15 mg twice daily prescription.           Follow-up Information    Follow up with Levert Feinstein, MD. Schedule an appointment as soon as possible for a visit in 5 days.   Contact information:   [address] [place] [address] [phone]       Schedule an appointment as  soon as possible for a visit with JONES, ANGIE L, NT.   Contact information:   [facility] [address]           The results of significant diagnostics from this hospitalization (including imaging, microbiology, ancillary and laboratory) are listed below for reference.    Significant Diagnostic Studies: Ct Angio Chest Pe W/cm &/or Wo Cm  05/26/2012  *RADIOLOGY REPORT*  Clinical Data: Right-sided chest pain and shortness of breath.  No deep venous thrombosis.  CT ANGIOGRAPHY CHEST  Technique:  Multidetector CT imaging of the chest using the standard protocol during bolus administration of intravenous contrast. Multiplanar reconstructed images including MIPs were obtained and reviewed to evaluate the vascular anatomy.  Contrast: OMNIPAQUE IOHEXOL 350 MG/ML SOLN  Comparison: 02/16/2012  Findings: Technically adequate study with moderately good opacification of the central and proximal segmental pulmonary arteries.  Peripheral vessels are not well opacified.  Filling defects are demonstrated and distal segmental and subsegmental branch vessels to the lower lobes bilaterally consistent with pulmonary emboli.  No central emboli are demonstrated.  Normal heart size.  Normal caliber thoracic aorta.  Coronary artery calcification.  Postoperative changes in the mediastinum.  Left- sided central venous catheter with tip in the mid SVC.  Enlarged right axillary lymph nodes seen previously are incompletely included on the study but appear to have been resolved or resected in the interval.  No significant lymphadenopathy noted in the chest today.  Visualized portions of the upper abdomen demonstrate enlarged lateral segment of the left lobe of the liver suggesting possible cirrhosis.  Hepatic cyst is stable.  Surgical absence of the gallbladder.  Small right pleural effusion with basilar atelectasis.  Motion artifact limits visualization of the lungs but there is no obvious lumbar consolidation.  No  pneumothorax.  The airways appear patent. Degenerative  changes in the thoracic spine.  No destructive bone lesions are appreciated.  IMPRESSION: Positive study for pulmonary embolus and bilateral lower lobe distal segmental and subsegmental vessels.  No central emboli. Small right pleural effusion with bilateral basilar atelectasis.  Critical Value/emergent results were called by telephone at the time of interpretation on 05/26/2012 at 2149 hours to Dr. Sharee Pimple, who verbally acknowledged these results.   Original Report Authenticated By: Marlon Pel, M.D.    Bilateral lower extremity venous Dopplers on 05/25/12  Summary:  - Findings consistent with acute deep vein thrombosis involving the left popliteal, posterior tibial, and peroneal veins. - No evidence of deep vein thrombosis involving the right lower extremity. - No evidence of Baker's cyst on the right or left.   Microbiology: No results found for this or any previous visit (from the past 240 hour(s)).   Labs: Basic Metabolic Panel:  Lab 05/27/12 2130 05/26/12 2040 05/24/12 1342  NA 135 134* 139  K 3.6 3.9 3.9  CL 99 98 101  CO2 24 22 25   GLUCOSE 177* 193* 192*  BUN 5* 6 7  CREATININE 0.72 0.65 0.68  CALCIUM 9.2 9.6 9.2  MG -- -- --  PHOS -- -- --   Liver Function Tests:  Lab 05/26/12 2040 05/24/12 1342  AST 19 12  ALT 19 21  ALKPHOS 65 63  BILITOT 0.8 0.9  PROT 7.0 6.5  ALBUMIN 3.7 4.2    Lab 05/26/12 2040  LIPASE 14  AMYLASE --   No results found for this basename: AMMONIA:5 in the last 168 hours CBC:  Lab 05/29/12 1115 05/28/12 0423 05/27/12 0445 05/26/12 2040 05/24/12 1342  WBC 6.9 5.2 4.6 4.1 2.9*  NEUTROABS -- -- -- 2.0 1.2*  HGB 12.0* 11.3* 12.2* 13.9 13.6  HCT 33.2* 33.2* 36.5* 40.1 39.9  MCV 86.9 87.1 88.2 86.8 88.1  PLT 293 268 269 295 184   Cardiac Enzymes:  Lab 05/26/12 2107  CKTOTAL --  CKMB --  CKMBINDEX --  TROPONINI <0.30   BNP: BNP (last 3 results) No results found for this  basename: PROBNP:3 in the last 8760 hours CBG:  Lab 05/29/12 1156 05/29/12 0736 05/28/12 2139 05/28/12 1759 05/28/12 1149  GLUCAP 164* 146* 160* 155* 163*   Other lab data:  1. Antithrombin III levels: 33. 2. TSH: 1.739. 3. Urinalysis: Negative for features of urinary tract infection.   Time coordinating discharge: Greater than 30 minutes  Signed:  Jeanell Mangan  [facility] 05/29/2012, 12:45 PM

## 2012-05-29 NOTE — Progress Notes (Signed)
Cm spoke with patient concerning dc planning. Cm provided assistance with medication via calling [facility] pharmacy concerning xarelto. Cm faxed rx for xarelto to [facility] pharmacy at [phone]. Fx received, patient co-pay for med $3.00. Patient's mother present at bedside to assist with tx & home care. Patient instructed to retrieve Xarelto from [facility] upon dc. Metoprolol e scripted to [facility] in [place] in which patient insrtucted to pick up. No other needs specified.   William Calderon [phone]

## 2012-05-31 ENCOUNTER — Other Ambulatory Visit: Payer: Medicaid Other | Admitting: Lab

## 2012-05-31 ENCOUNTER — Ambulatory Visit: Payer: Medicaid Other | Admitting: Nurse Practitioner

## 2012-06-01 ENCOUNTER — Telehealth: Payer: Self-pay | Admitting: Oncology

## 2012-06-01 ENCOUNTER — Ambulatory Visit (HOSPITAL_BASED_OUTPATIENT_CLINIC_OR_DEPARTMENT_OTHER): Payer: Medicaid Other | Admitting: Oncology

## 2012-06-01 ENCOUNTER — Ambulatory Visit (HOSPITAL_BASED_OUTPATIENT_CLINIC_OR_DEPARTMENT_OTHER): Payer: Medicaid Other | Admitting: Lab

## 2012-06-01 VITALS — BP 139/84 | HR 82 | Temp 98.2°F | Resp 24

## 2012-06-01 DIAGNOSIS — C8280 Other types of follicular lymphoma, unspecified site: Secondary | ICD-10-CM

## 2012-06-01 DIAGNOSIS — I2699 Other pulmonary embolism without acute cor pulmonale: Secondary | ICD-10-CM

## 2012-06-01 DIAGNOSIS — I824Z9 Acute embolism and thrombosis of unspecified deep veins of unspecified distal lower extremity: Secondary | ICD-10-CM

## 2012-06-01 DIAGNOSIS — C8299 Follicular lymphoma, unspecified, extranodal and solid organ sites: Secondary | ICD-10-CM

## 2012-06-01 DIAGNOSIS — I2510 Atherosclerotic heart disease of native coronary artery without angina pectoris: Secondary | ICD-10-CM

## 2012-06-01 DIAGNOSIS — C8584 Other specified types of non-Hodgkin lymphoma, lymph nodes of axilla and upper limb: Secondary | ICD-10-CM

## 2012-06-01 LAB — CBC WITH DIFFERENTIAL/PLATELET
BASO%: 1.3 % (ref 0.0–2.0)
Basophils Absolute: 0.2 10*3/uL — ABNORMAL HIGH (ref 0.0–0.1)
HCT: 39.4 % (ref 38.4–49.9)
LYMPH%: 12.1 % — ABNORMAL LOW (ref 14.0–49.0)
MCH: 29.7 pg (ref 27.2–33.4)
MCHC: 34.8 g/dL (ref 32.0–36.0)
MONO#: 1.1 10*3/uL — ABNORMAL HIGH (ref 0.1–0.9)
NEUT%: 77.8 % — ABNORMAL HIGH (ref 39.0–75.0)
Platelets: 355 10*3/uL (ref 140–400)
WBC: 13.5 10*3/uL — ABNORMAL HIGH (ref 4.0–10.3)

## 2012-06-01 NOTE — Progress Notes (Signed)
Hematology and Oncology Follow Up Visit  William Calderon [id number] [birth year] 51 y.o. 06/01/2012 3:49 PM   Principle Diagnosis: Encounter Diagnoses  Name Primary?  . Pulmonary embolism   . DVT, lower extremity, distal, acute   . Non-Hodgkin's lymphoma of axilla Yes     Interim History:  William Calderon recently completed or planned cycles of chemotherapy for localized high-grade B-cell non-Hodgkin's lymphoma presenting as a isolated right axillary lymph node mass. Plan was to proceed with involved field radiation therapy when blood counts recovered from the chemotherapy. He presented to our office last week with a 3 day history of sudden onset of pain and swelling of his left calf. Doppler study showed a deep venous thrombosis. He was started on therapeutic doses of Lovenox. Within 24 hours of starting Lovenox and after just 2 doses he presented with dyspnea and pleuritic chest pain. A CT scan done on August 21 showed small, bilateral, lower lobe pulmonary emboli. He was admitted to the hospital and started on parenteral heparin. There was difficulty in establishing good therapeutic levels. He was started on Xarelto. His symptoms subsided and he was discharged on August 24. He has some residual discomfort in the left calf but the pain has gone and he is ambulating without difficulty. Dyspnea and pleuritic chest pain have resolved.  Medications: reviewed  Allergies:  Allergies  Allergen Reactions  . Monosodium Glutamate     Headache and vision loss     Physical Exam: Blood pressure 139/84, pulse 82, temperature 98.2 F (36.8 C), temperature source Oral, resp. rate 24. Wt Readings from Last 3 Encounters:  05/29/12 245 lb 6 oz (111.3 kg)  05/25/12 249 lb (112.946 kg)  05/14/12 248 lb 9.6 oz (112.764 kg)     General appearance: Well-nourished Caucasian man William Calderon: Hair thinning from recent chemotherapy. Pharynx no erythema exudate or ulcer Lymph nodes: Complete resolution of  previous bulky right axillary adenopathy. No new areas of concern. Breasts: Lungs: Clear to auscultation resonant to percussion. Heart: Regular rhythm no murmur Abdomen: Soft nontender Extremities: No edema no calf tenderness. Right calf measures 43.5 cm left calf measures 43 cm right ankle and left ankle both 25 cm Vascular: No cyanosis Neurologic: No focal deficit Skin: No rash or ecchymosis  Lab Results: Lab Results  Component Value Date   WBC 13.5* 06/01/2012   HGB 13.7 06/01/2012   HCT 39.4 06/01/2012   MCV 85.3 06/01/2012   PLT 355 06/01/2012     Chemistry      Component Value Date/Time   NA 135 05/27/2012 0445   K 3.2* 05/29/2012 1306   CL 99 05/27/2012 0445   CO2 24 05/27/2012 0445   BUN 5* 05/27/2012 0445   CREATININE 0.72 05/27/2012 0445      Component Value Date/Time   CALCIUM 9.2 05/27/2012 0445   ALKPHOS 65 05/26/2012 2040   AST 19 05/26/2012 2040   ALT 19 05/26/2012 2040   BILITOT 0.8 05/26/2012 2040       Radiological Studies: Ct Angio Chest Pe W/cm &/or Wo Cm  05/26/2012  *RADIOLOGY REPORT*  Clinical Data: Right-sided chest pain and shortness of breath.  No deep venous thrombosis.  CT ANGIOGRAPHY CHEST  Technique:  Multidetector CT imaging of the chest using the standard protocol during bolus administration of intravenous contrast. Multiplanar reconstructed images including MIPs were obtained and reviewed to evaluate the vascular anatomy.  Contrast: OMNIPAQUE IOHEXOL 350 MG/ML SOLN  Comparison: 02/16/2012  Findings: Technically adequate study with moderately good opacification  of the central and proximal segmental pulmonary arteries.  Peripheral vessels are not well opacified.  Filling defects are demonstrated and distal segmental and subsegmental branch vessels to the lower lobes bilaterally consistent with pulmonary emboli.  No central emboli are demonstrated.  Normal heart size.  Normal caliber thoracic aorta.  Coronary artery calcification.  Postoperative changes in  the mediastinum.  Left- sided central venous catheter with tip in the mid SVC.  Enlarged right axillary lymph nodes seen previously are incompletely included on the study but appear to have been resolved or resected in the interval.  No significant lymphadenopathy noted in the chest today.  Visualized portions of the upper abdomen demonstrate enlarged lateral segment of the left lobe of the liver suggesting possible cirrhosis.  Hepatic cyst is stable.  Surgical absence of the gallbladder.  Small right pleural effusion with basilar atelectasis.  Motion artifact limits visualization of the lungs but there is no obvious lumbar consolidation.  No pneumothorax.  The airways appear patent. Degenerative changes in the thoracic spine.  No destructive bone lesions are appreciated.  IMPRESSION: Positive study for pulmonary embolus and bilateral lower lobe distal segmental and subsegmental vessels.  No central emboli. Small right pleural effusion with bilateral basilar atelectasis.  Critical Value/emergent results were called by telephone at the time of interpretation on 05/26/2012 at 2149 hours to Dr. Sharee Pimple, who verbally acknowledged these results.   Original Report Authenticated By: Marlon Pel, M.D.     Impression and Plan: #1. Acute left lower extremity DVT and bilateral lower lobe pulmonary emboli in a man who recently completed chemotherapy for high-grade lymphoma. It is much less common to see thrombotic events in lymphoma patients that it is in solid tumor patients. Given this fact I am going to check a hypercoagulation profile to see if he has any congenital or or acquired risk factors for thrombosis both to be able to counsel him on duration of anticoagulation and for family counseling. If there are no identifiable risk factors other than his lymphoma and chemotherapy, then I will limit anticoagulation to a total of 6 months.  #2. Stage Ia, high-grade, B-cell, non-Hodgkin's lymphoma. I see no reason to  delay his involved field radiation because of the blood clots. He is due for a simulation procedure tomorrow and will likely start the radiation the day after Labor Day.  #3. 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 Type 2 diabetes  #5. Cerebrovascular disease with history of prior stroke  #6. Essential hypertension  #7. Tobacco addiction   CC:. Dr. Samuel Jester; Dr. Manus Rudd; Dr. Lonie Peak   Levert Feinstein, MD 8/27/20133:49 PM

## 2012-06-01 NOTE — Telephone Encounter (Signed)
appts made and printed for pt aom °

## 2012-06-02 ENCOUNTER — Ambulatory Visit
Admission: RE | Admit: 2012-06-02 | Discharge: 2012-06-02 | Disposition: A | Discharge status: Home / Self Care | Payer: Medicaid Other | Source: Ambulatory Visit | Attending: Radiation Oncology | Admitting: Radiation Oncology

## 2012-06-02 DIAGNOSIS — C8594 Non-Hodgkin lymphoma, unspecified, lymph nodes of axilla and upper limb: Secondary | ICD-10-CM

## 2012-06-02 NOTE — Progress Notes (Signed)
SIMULATION / TREATMENT PLANNING NOTE   The patient was taken to the CT simulator and laid in the supine position, arms over his head in a wingboard.  High resolution CT axial imaging was obtained of the patient's chest/axilla .  An isocenter was placed in right axilla. Skin markings were made.  I plan to treat the patient's right axilla with AP/PA beams, using MLCs for custom blocks, to a dose of 30.6 Gy/17 fractions.

## 2012-06-03 LAB — BETA-2 GLYCOPROTEIN ANTIBODIES
Beta-2-Glycoprotein I IgA: 3 A Units (ref <20)
Beta-2-Glycoprotein I IgM: 1 M Units (ref <20)

## 2012-06-03 LAB — PROTHROMBIN GENE MUTATION

## 2012-06-03 LAB — LUPUS ANTICOAGULANT PANEL
DRVVT 1:1 Mix: 47.4 secs — ABNORMAL HIGH (ref <45.1)
DRVVT: 68.8 secs — ABNORMAL HIGH (ref <45.1)
Drvvt confirmation: 1.24 Ratio — ABNORMAL HIGH (ref <1.16)
Lupus Anticoagulant: DETECTED — AB

## 2012-06-03 LAB — CARDIOLIPIN ANTIBODIES, IGG, IGM, IGA: Anticardiolipin IgG: 4 GPL U/mL (ref <23)

## 2012-06-03 LAB — FACTOR 5 LEIDEN

## 2012-06-10 ENCOUNTER — Ambulatory Visit
Admission: RE | Admit: 2012-06-10 | Discharge: 2012-06-10 | Disposition: A | Discharge status: Home / Self Care | Payer: Medicaid Other | Source: Ambulatory Visit | Attending: Radiation Oncology | Admitting: Radiation Oncology

## 2012-06-10 DIAGNOSIS — C8594 Non-Hodgkin lymphoma, unspecified, lymph nodes of axilla and upper limb: Secondary | ICD-10-CM

## 2012-06-11 NOTE — Progress Notes (Signed)
VERIFICATION SIMULATION NOTE  The patient was laid in the correct position on the treatment table for simulation verification. Portal imaging was obtained and I verified the fields and MLCs for his axilla to be accurate. The patient tolerated the procedure well.  -----------------------------------------------------  Lonie Peak, MD

## 2012-06-14 ENCOUNTER — Ambulatory Visit
Admission: RE | Admit: 2012-06-14 | Discharge: 2012-06-14 | Disposition: A | Discharge status: Home / Self Care | Payer: Medicaid Other | Source: Ambulatory Visit | Attending: Radiation Oncology | Admitting: Radiation Oncology

## 2012-06-14 ENCOUNTER — Encounter: Payer: Self-pay | Admitting: Radiation Oncology

## 2012-06-14 VITALS — BP 120/84 | HR 94 | Temp 98.4°F | Resp 20 | Wt 243.0 lb

## 2012-06-14 DIAGNOSIS — C8594 Non-Hodgkin lymphoma, unspecified, lymph nodes of axilla and upper limb: Secondary | ICD-10-CM

## 2012-06-14 MED ORDER — RADIAPLEXRX EX GEL
Freq: Once | CUTANEOUS | Status: AC
  Administered 2012-06-14: 12:00:00 via TOPICAL

## 2012-06-14 NOTE — Progress Notes (Signed)
Pt alert,oriented x3, post sim teaching radiplex gel and flyer on skin products given to patient, here for 1st treatment radiation  Right axilla, will see MD weekly/prn, no c./ pain, just legs weak from dvt   11:27 AM

## 2012-06-14 NOTE — Progress Notes (Signed)
   Weekly Management Note Current Dose:  Seen before his first fraction of 1.8 Gy Projected Dose: 30.6  Gy   Narrative:  The patient presents for routine under treatment assessment.  CBCT/MVCT images/Port film x-rays were reviewed.  The chart was checked. Doing well. No questions today.  Physical Findings:  vitals were not taken for this visit. sitting comfortably in a chair in no acute distress.  Impression: Doing well.  Plan:  Continue radiotherapy as planned.  ________________________________   Lonie Peak, M.D.

## 2012-06-15 ENCOUNTER — Ambulatory Visit
Admission: RE | Admit: 2012-06-15 | Discharge: 2012-06-15 | Disposition: A | Discharge status: Home / Self Care | Payer: Medicaid Other | Source: Ambulatory Visit | Attending: Radiation Oncology | Admitting: Radiation Oncology

## 2012-06-16 ENCOUNTER — Ambulatory Visit
Admission: RE | Admit: 2012-06-16 | Discharge: 2012-06-16 | Disposition: A | Discharge status: Home / Self Care | Payer: Medicaid Other | Source: Ambulatory Visit | Attending: Radiation Oncology | Admitting: Radiation Oncology

## 2012-06-17 ENCOUNTER — Ambulatory Visit
Admission: RE | Admit: 2012-06-17 | Discharge: 2012-06-17 | Disposition: A | Discharge status: Home / Self Care | Payer: Medicaid Other | Source: Ambulatory Visit | Attending: Radiation Oncology | Admitting: Radiation Oncology

## 2012-06-18 ENCOUNTER — Ambulatory Visit
Admission: RE | Admit: 2012-06-18 | Discharge: 2012-06-18 | Disposition: A | Discharge status: Home / Self Care | Payer: Medicaid Other | Source: Ambulatory Visit | Attending: Radiation Oncology | Admitting: Radiation Oncology

## 2012-06-21 ENCOUNTER — Ambulatory Visit
Admission: RE | Admit: 2012-06-21 | Discharge: 2012-06-21 | Disposition: A | Discharge status: Home / Self Care | Payer: Medicaid Other | Source: Ambulatory Visit | Attending: Radiation Oncology | Admitting: Radiation Oncology

## 2012-06-21 ENCOUNTER — Encounter: Payer: Self-pay | Admitting: Radiation Oncology

## 2012-06-21 VITALS — BP 132/83 | HR 91 | Temp 97.8°F | Wt 244.7 lb

## 2012-06-21 DIAGNOSIS — C8594 Non-Hodgkin lymphoma, unspecified, lymph nodes of axilla and upper limb: Secondary | ICD-10-CM

## 2012-06-21 NOTE — Progress Notes (Signed)
   Weekly Management Note Current Dose:  10.8 Gy  Projected Dose:  30.6 Gy   Narrative:  The patient presents for routine under treatment assessment.  CBCT/MVCT images/Port film x-rays were reviewed.  The chart was checked. He is doing well with no complaints  Physical Findings:  weight is 244 lb 11.2 oz (110.995 kg). His temperature is 97.8 F (36.6 C). His blood pressure is 132/83 and his pulse is 91.  early erythema over the right axilla.  Impression:  The patient is tolerating radiotherapy.  Plan:  Continue radiotherapy as planned.  ________________________________   Lonie Peak, M.D.

## 2012-06-21 NOTE — Progress Notes (Signed)
William Calderon with no voiced complaints

## 2012-06-22 ENCOUNTER — Ambulatory Visit
Admission: RE | Admit: 2012-06-22 | Discharge: 2012-06-22 | Disposition: A | Discharge status: Home / Self Care | Payer: Medicaid Other | Source: Ambulatory Visit | Attending: Radiation Oncology | Admitting: Radiation Oncology

## 2012-06-23 ENCOUNTER — Ambulatory Visit
Admission: RE | Admit: 2012-06-23 | Discharge: 2012-06-23 | Disposition: A | Discharge status: Home / Self Care | Payer: Medicaid Other | Source: Ambulatory Visit | Attending: Radiation Oncology | Admitting: Radiation Oncology

## 2012-06-24 ENCOUNTER — Other Ambulatory Visit: Payer: Medicaid Other | Admitting: Lab

## 2012-06-24 ENCOUNTER — Ambulatory Visit
Admission: RE | Admit: 2012-06-24 | Discharge: 2012-06-24 | Disposition: A | Discharge status: Home / Self Care | Payer: Medicaid Other | Source: Ambulatory Visit | Attending: Radiation Oncology | Admitting: Radiation Oncology

## 2012-06-25 ENCOUNTER — Ambulatory Visit
Admission: RE | Admit: 2012-06-25 | Discharge: 2012-06-25 | Disposition: A | Discharge status: Home / Self Care | Payer: Medicaid Other | Source: Ambulatory Visit | Attending: Radiation Oncology | Admitting: Radiation Oncology

## 2012-06-28 ENCOUNTER — Ambulatory Visit
Admission: RE | Admit: 2012-06-28 | Discharge: 2012-06-28 | Disposition: A | Discharge status: Home / Self Care | Payer: Medicaid Other | Source: Ambulatory Visit | Attending: Radiation Oncology | Admitting: Radiation Oncology

## 2012-06-29 ENCOUNTER — Ambulatory Visit
Admission: RE | Admit: 2012-06-29 | Discharge: 2012-06-29 | Disposition: A | Discharge status: Home / Self Care | Payer: Medicaid Other | Source: Ambulatory Visit | Attending: Radiation Oncology | Admitting: Radiation Oncology

## 2012-06-30 ENCOUNTER — Encounter: Payer: Self-pay | Admitting: Radiation Oncology

## 2012-06-30 ENCOUNTER — Ambulatory Visit
Admission: RE | Admit: 2012-06-30 | Discharge: 2012-06-30 | Disposition: A | Discharge status: Home / Self Care | Payer: Medicaid Other | Source: Ambulatory Visit | Attending: Radiation Oncology | Admitting: Radiation Oncology

## 2012-06-30 VITALS — BP 134/80 | HR 81 | Temp 98.3°F | Resp 20 | Wt 246.5 lb

## 2012-06-30 DIAGNOSIS — C8594 Non-Hodgkin lymphoma, unspecified, lymph nodes of axilla and upper limb: Secondary | ICD-10-CM

## 2012-06-30 NOTE — Progress Notes (Signed)
Pt denies pain, skin irritation, is fatigued and has loss of appetite. He states "nothing sounds good, but I make myself eat."

## 2012-06-30 NOTE — Progress Notes (Signed)
   Weekly Management Note Current Dose:  23.4 Gy  Projected Dose: 30.6 Gy   Narrative:  The patient presents for routine under treatment assessment.  CBCT/MVCT images/Port film x-rays were reviewed.  The chart was checked. Denies increase in fatigue.  Skin a little red.  Physical Findings:  weight is 246 lb 8 oz (111.812 kg). His oral temperature is 98.3 F (36.8 C). His blood pressure is 134/80 and his pulse is 81. His respiration is 20.  Mild erythema in Right Axillary region.  Impression:  The patient is tolerating radiotherapy.  Plan:  Continue radiotherapy as planned. Told to use radiaplex twice/day.  ________________________________   Lonie Peak, M.D.

## 2012-07-01 ENCOUNTER — Ambulatory Visit
Admission: RE | Admit: 2012-07-01 | Discharge: 2012-07-01 | Disposition: A | Discharge status: Home / Self Care | Payer: Medicaid Other | Source: Ambulatory Visit | Attending: Radiation Oncology | Admitting: Radiation Oncology

## 2012-07-01 ENCOUNTER — Other Ambulatory Visit: Payer: Medicaid Other | Admitting: Lab

## 2012-07-02 ENCOUNTER — Other Ambulatory Visit: Payer: Self-pay

## 2012-07-02 ENCOUNTER — Ambulatory Visit
Admission: RE | Admit: 2012-07-02 | Discharge: 2012-07-02 | Disposition: A | Discharge status: Home / Self Care | Payer: Medicaid Other | Source: Ambulatory Visit | Attending: Radiation Oncology | Admitting: Radiation Oncology

## 2012-07-02 DIAGNOSIS — I2699 Other pulmonary embolism without acute cor pulmonale: Secondary | ICD-10-CM

## 2012-07-02 MED ORDER — RIVAROXABAN 20 MG PO TABS: 20.0000 mg | ORAL_TABLET | Freq: Every day | ORAL | Status: DC

## 2012-07-02 NOTE — Telephone Encounter (Signed)
Notified patient that refills were sent to [facility] in [place].

## 2012-07-05 ENCOUNTER — Encounter: Payer: Self-pay | Admitting: Radiation Oncology

## 2012-07-05 ENCOUNTER — Ambulatory Visit
Admission: RE | Admit: 2012-07-05 | Discharge: 2012-07-05 | Disposition: A | Discharge status: Home / Self Care | Payer: Medicaid Other | Source: Ambulatory Visit | Attending: Radiation Oncology | Admitting: Radiation Oncology

## 2012-07-05 VITALS — BP 140/73 | HR 79 | Temp 98.4°F | Wt 247.4 lb

## 2012-07-05 DIAGNOSIS — C8594 Non-Hodgkin lymphoma, unspecified, lymph nodes of axilla and upper limb: Secondary | ICD-10-CM

## 2012-07-05 NOTE — Progress Notes (Signed)
16/17 to right axilla.  Denies any pain.  He reports fatigue but states it is the same as it has been since May and no worse since start of treatment.  Erythema noted in treatment field.  No other voiced concerns.

## 2012-07-05 NOTE — Progress Notes (Signed)
   Weekly Management Note Current Dose:  28.8 Gy  Projected Dose: 30.6 Gy   Narrative:  The patient presents for routine under treatment assessment.  CBCT/MVCT images/Port film x-rays were reviewed.  The chart was checked. No complaints; using radiaplex when he "remembers"  Physical Findings:  weight is 247 lb 6.4 oz (112.22 kg). His temperature is 98.4 F (36.9 C). His blood pressure is 140/73 and his pulse is 79.  erythema but no desquamation over the right axilla  Impression:  The patient is tolerating radiotherapy.  Plan:  Continue radiotherapy as planned. One month followup card given. He will continue radiaplex: I encouraged his to use it 3 times per day. He can transition to vitamin E cream when he runs of the radiaplex. He's been encouraged to call if he has any issues before his one-month followup.  ________________________________   Lonie Peak, M.D.

## 2012-07-05 NOTE — Addendum Note (Signed)
Encounter addended by: Lonie Peak, MD on: 07/05/2012 11:11 AM<BR>     Documentation filed: Flowsheet VN, Notes Section

## 2012-07-06 ENCOUNTER — Encounter: Payer: Self-pay | Admitting: Radiation Oncology

## 2012-07-06 ENCOUNTER — Ambulatory Visit
Admission: RE | Admit: 2012-07-06 | Discharge: 2012-07-06 | Disposition: A | Discharge status: Home / Self Care | Payer: Medicaid Other | Source: Ambulatory Visit | Attending: Radiation Oncology | Admitting: Radiation Oncology

## 2012-07-07 ENCOUNTER — Ambulatory Visit: Payer: Medicaid Other

## 2012-07-08 ENCOUNTER — Ambulatory Visit: Payer: Medicaid Other

## 2012-07-09 ENCOUNTER — Ambulatory Visit: Payer: Medicaid Other

## 2012-07-09 NOTE — Progress Notes (Signed)
  Radiation Oncology         [phone]) [phone] ________________________________  Name: William Calderon MRN: [id number]  Date: 07/06/2012  DOB: 1960-11-25  End of Treatment Note  Diagnosis:   Nodular High Grade B-Cell Lymphoma,Stage IB.  Indication for treatment:  Curative     Radiation treatment dates:   06/14/2012-07/06/2012  Site/dose:  Right axilla/30.6 gray in 17 fractions  Beams/energy: AP PA /10 and 15 MV photons  Narrative: The patient tolerated radiation treatment relatively well.  He had no complaints by the end of treatment.  Plan: The patient has completed radiation treatment. He has been instructed to use radiaplex over his skin. He's been told that he can transition to vitamin E cream when he runs out of the radiaplex. The patient will return to radiation oncology clinic for routine followup in one month. I advised them to call or return sooner if they have any questions or concerns related to their recovery or treatment.  -----------------------------------  Lonie Peak, MD

## 2012-07-20 ENCOUNTER — Encounter: Payer: Self-pay | Admitting: Oncology

## 2012-07-20 NOTE — Progress Notes (Unsigned)
07/20/2012  I spke with Mr. Ramiro, RE: PET SCAN is still pending for approval.  I have cancelled the scan scheduled for Wed. And will reschedule once I have approval.  Patient voiced understanding.  Bonita Quin  [phone]

## 2012-07-21 ENCOUNTER — Other Ambulatory Visit: Payer: Medicaid Other

## 2012-07-21 ENCOUNTER — Encounter (HOSPITAL_COMMUNITY): Payer: Medicaid Other

## 2012-07-22 ENCOUNTER — Telehealth: Payer: Self-pay | Admitting: Oncology

## 2012-07-22 NOTE — Telephone Encounter (Signed)
Per William Calderon (precert) do due pet being moved to 10/23 f/u moved to 10/29 @ 4:30 pm - d/t per JG. Also pt did have lb done on 10/16 so lb added for 10/23 before ct @ 1pm. Ms. William Calderon to contact pt.

## 2012-07-26 ENCOUNTER — Ambulatory Visit: Payer: Medicaid Other | Admitting: Oncology

## 2012-07-28 ENCOUNTER — Encounter (HOSPITAL_COMMUNITY): Payer: Self-pay

## 2012-07-28 ENCOUNTER — Encounter (HOSPITAL_COMMUNITY)
Admission: RE | Admit: 2012-07-28 | Discharge: 2012-07-28 | Disposition: A | Discharge status: Home / Self Care | Payer: Medicaid Other | Source: Ambulatory Visit | Attending: Oncology | Admitting: Oncology

## 2012-07-28 ENCOUNTER — Other Ambulatory Visit (HOSPITAL_BASED_OUTPATIENT_CLINIC_OR_DEPARTMENT_OTHER): Payer: Medicaid Other | Admitting: Lab

## 2012-07-28 DIAGNOSIS — C8594 Non-Hodgkin lymphoma, unspecified, lymph nodes of axilla and upper limb: Secondary | ICD-10-CM

## 2012-07-28 DIAGNOSIS — I824Z9 Acute embolism and thrombosis of unspecified deep veins of unspecified distal lower extremity: Secondary | ICD-10-CM

## 2012-07-28 DIAGNOSIS — C8584 Other specified types of non-Hodgkin lymphoma, lymph nodes of axilla and upper limb: Secondary | ICD-10-CM

## 2012-07-28 DIAGNOSIS — I2699 Other pulmonary embolism without acute cor pulmonale: Secondary | ICD-10-CM

## 2012-07-28 DIAGNOSIS — Z9221 Personal history of antineoplastic chemotherapy: Secondary | ICD-10-CM | POA: Insufficient documentation

## 2012-07-28 DIAGNOSIS — Z923 Personal history of irradiation: Secondary | ICD-10-CM | POA: Insufficient documentation

## 2012-07-28 LAB — COMPREHENSIVE METABOLIC PANEL (CC13)
Albumin: 4 g/dL (ref 3.5–5.0)
CO2: 21 mEq/L — ABNORMAL LOW (ref 22–29)
Calcium: 9.5 mg/dL (ref 8.4–10.4)
Glucose: 148 mg/dl — ABNORMAL HIGH (ref 70–99)
Sodium: 138 mEq/L (ref 136–145)
Total Bilirubin: 0.7 mg/dL (ref 0.20–1.20)
Total Protein: 6.6 g/dL (ref 6.4–8.3)

## 2012-07-28 LAB — CBC WITH DIFFERENTIAL/PLATELET
BASO%: 0.9 % (ref 0.0–2.0)
Basophils Absolute: 0.1 10*3/uL (ref 0.0–0.1)
Eosinophils Absolute: 0.3 10*3/uL (ref 0.0–0.5)
HCT: 47.4 % (ref 38.4–49.9)
HGB: 16.4 g/dL (ref 13.0–17.1)
LYMPH%: 13.8 % — ABNORMAL LOW (ref 14.0–49.0)
MCHC: 34.5 g/dL (ref 32.0–36.0)
MONO#: 0.6 10*3/uL (ref 0.1–0.9)
NEUT%: 74.4 % (ref 39.0–75.0)
Platelets: 176 10*3/uL (ref 140–400)
WBC: 8 10*3/uL (ref 4.0–10.3)

## 2012-07-28 LAB — D-DIMER, QUANTITATIVE: D-Dimer, Quant: 0.57 ug/mL-FEU — ABNORMAL HIGH (ref 0.00–0.48)

## 2012-07-28 LAB — LACTATE DEHYDROGENASE (CC13): LDH: 196 U/L (ref 125–220)

## 2012-07-28 MED ORDER — FLUDEOXYGLUCOSE F - 18 (FDG) INJECTION
16.1000 | Freq: Once | INTRAVENOUS | Status: AC | PRN
  Administered 2012-07-28: 16.1 via INTRAVENOUS

## 2012-07-30 ENCOUNTER — Telehealth: Payer: Self-pay | Admitting: *Deleted

## 2012-07-30 NOTE — Telephone Encounter (Signed)
Message copied by Sabino Snipes on Fri Jul 30, 2012  1:05 PM ------      Message from: Levert Feinstein      Created: Wed Jul 28, 2012  6:11 PM       Call pt: PET scan shows complete remission

## 2012-07-30 NOTE — Telephone Encounter (Signed)
Pt given result of PET per Dr. Cyndie Chime.  Pt was very pleased & will see Dr Cyndie Chime 08/03/12.

## 2012-08-03 ENCOUNTER — Ambulatory Visit (HOSPITAL_BASED_OUTPATIENT_CLINIC_OR_DEPARTMENT_OTHER): Payer: Medicaid Other | Admitting: Oncology

## 2012-08-03 VITALS — BP 137/87 | HR 85 | Temp 98.7°F | Resp 20 | Ht 67.0 in | Wt 249.8 lb

## 2012-08-03 DIAGNOSIS — C8590 Non-Hodgkin lymphoma, unspecified, unspecified site: Secondary | ICD-10-CM

## 2012-08-03 DIAGNOSIS — I824Z9 Acute embolism and thrombosis of unspecified deep veins of unspecified distal lower extremity: Secondary | ICD-10-CM

## 2012-08-03 DIAGNOSIS — Z7901 Long term (current) use of anticoagulants: Secondary | ICD-10-CM

## 2012-08-03 DIAGNOSIS — C8584 Other specified types of non-Hodgkin lymphoma, lymph nodes of axilla and upper limb: Secondary | ICD-10-CM

## 2012-08-03 DIAGNOSIS — I82409 Acute embolism and thrombosis of unspecified deep veins of unspecified lower extremity: Secondary | ICD-10-CM

## 2012-08-03 DIAGNOSIS — I2699 Other pulmonary embolism without acute cor pulmonale: Secondary | ICD-10-CM

## 2012-08-03 MED ORDER — HEPARIN SOD (PORK) LOCK FLUSH 100 UNIT/ML IV SOLN
500.0000 [IU] | Freq: Once | INTRAVENOUS | Status: AC
  Administered 2012-08-03: 500 [IU] via INTRAVENOUS
  Filled 2012-08-03: qty 5

## 2012-08-03 MED ORDER — SODIUM CHLORIDE 0.9 % IJ SOLN
10.0000 mL | INTRAMUSCULAR | Status: DC | PRN
  Administered 2012-08-03: 10 mL via INTRAVENOUS
  Filled 2012-08-03: qty 10

## 2012-08-03 NOTE — Progress Notes (Signed)
William Calderon [id number] 18-Apr-1961 51 y.o. 08/03/2012 9:00 PM  CC: Prudy Feeler NP; Manus Rudd M.D.; Dr. Lonie Peak. radiation oncology; Dr. Olga Millers   Diagnosis: Stage IB, high-grade, B-cell, non-Hodgkin's lymphoma  Treatment Dates:  Chemotherapy: 4 cycles Cytoxan, etoposide, vincristine, prednisone, Rituxan 03/09/2012 through August 7,2013 Radiation therapy: 30.6 gray in 17 fractions between September 9 and 07/06/2012  History of Present Illness:  51 year old man with multiple medical problems who presented with progressive swelling in his right axilla which occurred over a one-year interval. He had a large, 6 cm, lymph node mass limited to the right axilla on exam with no other areas of obvious adenopathy. He underwent an excisional biopsy on 01/27/2012. Findings were a 7 x 6 x 4 cm lymph node mass. 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 Immunophenotype: CD20, CD79a, BCL-2, and BCL 6 positive. Pattern of involvement predominantly follicular but early diffuse areas. He had no constitutional symptoms except for some decreased appetite. Serum LDH normal at 150. No bone marrow involvement. PET scan showed no disease outside the right axilla.  He has underlying coronary artery disease status post previous MI, previous bypass surgery, secondary cardiomyopathy with ejection fraction of 40%, poorly controlled diabetes, hypertension, cerebrovascular disease with previous stroke, hyperlipidemia, sleep apnea syndrome, and ongoing tobacco use. I felt that he was at excessive risk for use of anthracycline chemotherapy as part of his treatment regimen. I substituted Adriamycin with Etoposide. Initial Cytoxan dose 750 mg per meter squared day 1, Etoposide 50 mg per meter squared day 1, 2, 3, vincristine 2 mg day 1, prednisone 100 mg 5 days with each cycle, and Rituxan 375 mg per meter squared day 1. Cytoxan dose was escalated to 1000 mg per meter  squared and etoposide dose to 60 mg per meter squared day 1, 2, 3 for the final cycle. He had complete clinical and radiographic regression of the right axillary adenopathy. He proceeded with radiation therapy to the involved field.  He was restaged in anticipation of today's visit with a CT scan and a PET scan done 07/28/2012. This shows complete CT and PET response.  Course was complicated by development of an acute left lower extremity DVT and bilateral pulmonary emboli occurring on August 20. The DVT involving the popliteal, posterior tibial, and peroneal veins. He was initially started on subcutaneous Lovenox but do to development of acute dyspnea with findings of acute bilateral distal segmental and subsegmental pulmonary emboli on CT angiogram of the chest done on August 21, he was admitted to the hospital and put on parenteral unfractionated heparin and subsequently Xarelto.  A hypercoagulation profile was done and most of the results are unreliable due to both heparin and Xarelto. Antithrombin level was low at 33% and a lupus-type anticoagulant was positive with negative antibodies against beta-2 glycoprotein 1. Both of these findings are consistent with drug effect and are spurious.  He is complaining of severe weakness and profound fatigue and is unable to do his normal activities. He is developing progressive large joint pain in his hips and his knees. He has dysesthesias of his feet. He has not been monitoring his sugars and stopped his Glucophage.  Medications:   Allergies:   Allergies  Allergen Reactions  . Monosodium Glutamate     Headache and vision loss    Physical Exam: Blood pressure 137/87, pulse 85, temperature 98.7 F (37.1 C), temperature source Oral, resp. rate 20, height 5\' 7"  (1.702 m), weight 249 lb  12.8 oz (113.309 kg). Anxious, overweight, Caucasian man HEENT: Pharynx no erythema, exudate, or mass. Neck full range of motion. Lymph system: Complete regression of  previous bulky right axillary adenopathy. No other areas of adenopathy in the neck, supraclavicular, or left axillary regions. Lungs: Clear to auscultation resonant to percussion Heart: Regular rhythm no murmur or gallop Abdomen: Soft, nontender, no mass no organomegaly. Extremities, no edema, no calf tenderness Vascular: No cyanosis Neurologic: Motor strength is 5 over 5, reflexes 1+ symmetric, sensation is intact to vibration over the fingertips by tuning fork exam, cranial nerves grossly normal  Lab Results: Lab Results  Component Value Date   WBC 8.0 07/28/2012   HGB 16.4 07/28/2012   HCT 47.4 07/28/2012   MCV 89.3 07/28/2012   PLT 176 07/28/2012     Chemistry      Component Value Date/Time   NA 138 07/28/2012 1315   NA 135 05/27/2012 0445   K 3.4* 07/28/2012 1315   K 3.2* 05/29/2012 1306   CL 106 07/28/2012 1315   CL 99 05/27/2012 0445   CO2 21* 07/28/2012 1315   CO2 24 05/27/2012 0445   BUN 6.0* 07/28/2012 1315   BUN 5* 05/27/2012 0445   CREATININE 0.7 07/28/2012 1315   CREATININE 0.72 05/27/2012 0445      Component Value Date/Time   CALCIUM 9.5 07/28/2012 1315   CALCIUM 9.2 05/27/2012 0445   ALKPHOS 81 07/28/2012 1315   ALKPHOS 65 05/26/2012 2040   AST 16 07/28/2012 1315   AST 19 05/26/2012 2040   ALT 23 07/28/2012 1315   ALT 19 05/26/2012 2040   BILITOT 0.70 07/28/2012 1315   BILITOT 0.8 05/26/2012 2040       Radiological Studies: See discussion above  Impression: #1. Stage IB, IPI low risk, follicular grade 3, B-cell, non-Hodgkin's lymphoma Complete remission with treatment outlined above.  #2. Acute left lower extremity DVT and bilateral pulmonary emboli. Currently on Xarelto.  #3. Persistent, progressive, constitutional symptoms as well as large joint arthralgias. Not clear what the etiology of these symptoms is. I would consider an atypical immune reaction to Rituxan (serum sickness type reaction) versus a medication reaction-Xarelto. I suggested to him  that he stop the Xarelto and use Lovenox for 2 weeks to see if the constitutional and joint symptoms subside. He was unwilling to do this at this time.  #4. Coronary artery disease status post MI status post bypass surgery  #5. 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 Type 2 diabetes poorly controlled due to noncompliance  #7. Essential hypertension.  #8. Hyperlipidemia.  #9. Tobacco addiction.  #10. Anxiety and depression  #11. Cerebrovascular disease with prior stroke  #12. Gout  #13. Sleep apnea syndrome   Follow Up Plan:  With respect to his lymphoma, I will get followup CT scans every 4 months for the first 2 years, every 6 months for year 3, and then on a when necessary basis.  With respect to his DVT and pulmonary embolus, I would recommend continuing full dose anticoagulation for one year through August of 2014. I discussed this with him and his wife today. There is no value in repeating the lupus anticoagulant or antithrombin levels when he is on a direct  X A. inhibitor since this will affect the results.  Levert Feinstein, MD 10/29/20139:00 PM

## 2012-08-06 ENCOUNTER — Ambulatory Visit
Admission: RE | Admit: 2012-08-06 | Discharge: 2012-08-06 | Disposition: A | Discharge status: Home / Self Care | Payer: Medicaid Other | Source: Ambulatory Visit | Attending: Radiation Oncology | Admitting: Radiation Oncology

## 2012-08-06 ENCOUNTER — Encounter: Payer: Self-pay | Admitting: Radiation Oncology

## 2012-08-06 VITALS — BP 142/76 | HR 80 | Temp 97.7°F | Wt 251.3 lb

## 2012-08-06 DIAGNOSIS — C8590 Non-Hodgkin lymphoma, unspecified, unspecified site: Secondary | ICD-10-CM

## 2012-08-06 DIAGNOSIS — C8594 Non-Hodgkin lymphoma, unspecified, lymph nodes of axilla and upper limb: Secondary | ICD-10-CM

## 2012-08-06 HISTORY — DX: Personal history of antineoplastic chemotherapy: Z92.21

## 2012-08-06 HISTORY — DX: Cardiomyopathy, unspecified: I42.9

## 2012-08-06 HISTORY — DX: Personal history of irradiation: Z92.3

## 2012-08-06 NOTE — Progress Notes (Signed)
FU appointment today.   Reports that he has "no energy" and feels weak. C/o pain in his hips and knees and is exacerbated by walking, but subsides when he rest.  History of Stroke 2 years ago and has numbness in his left arm and is aphasic when he is stressed.  States his balance is "off" at times.  Numbness and tingling in feet bilaterally.    Reports lack of appetite, but "I eat".  Hard to find something he really wants to eat.
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
## 2012-09-06 ENCOUNTER — Other Ambulatory Visit (HOSPITAL_COMMUNITY): Payer: Self-pay | Admitting: Internal Medicine

## 2013-01-11 ENCOUNTER — Other Ambulatory Visit: Payer: Self-pay | Admitting: *Deleted

## 2013-01-11 ENCOUNTER — Telehealth: Payer: Self-pay | Admitting: *Deleted

## 2013-01-11 NOTE — Telephone Encounter (Addendum)
Received vm call from pt stating that he hasn't had his port fllushed since Oct & would like to have it removed if possible.  Note to Dr Cyndie Chime. Per Dr Cyndie Chime, OK to remove port.  Call made to Dr Tsuei's office & appt for 01/31/13 made @ 1:20pm for f/u & port removal will be scheduled then.  Pt. Notified.

## 2013-01-15 ENCOUNTER — Other Ambulatory Visit: Payer: Self-pay | Admitting: Oncology

## 2013-01-15 DIAGNOSIS — C8280 Other types of follicular lymphoma, unspecified site: Secondary | ICD-10-CM

## 2013-01-17 ENCOUNTER — Telehealth: Payer: Self-pay | Admitting: Oncology

## 2013-01-24 ENCOUNTER — Ambulatory Visit (HOSPITAL_COMMUNITY)
Admission: RE | Admit: 2013-01-24 | Discharge: 2013-01-24 | Disposition: A | Discharge status: Home / Self Care | Payer: Medicaid Other | Source: Ambulatory Visit | Attending: Oncology | Admitting: Oncology

## 2013-01-24 ENCOUNTER — Other Ambulatory Visit: Payer: Medicaid Other | Admitting: Lab

## 2013-01-24 ENCOUNTER — Encounter (HOSPITAL_COMMUNITY): Payer: Self-pay

## 2013-01-24 DIAGNOSIS — C8584 Other specified types of non-Hodgkin lymphoma, lymph nodes of axilla and upper limb: Secondary | ICD-10-CM

## 2013-01-24 DIAGNOSIS — C8280 Other types of follicular lymphoma, unspecified site: Secondary | ICD-10-CM

## 2013-01-24 DIAGNOSIS — C8589 Other specified types of non-Hodgkin lymphoma, extranodal and solid organ sites: Secondary | ICD-10-CM | POA: Insufficient documentation

## 2013-01-24 DIAGNOSIS — I700 Atherosclerosis of aorta: Secondary | ICD-10-CM | POA: Insufficient documentation

## 2013-01-24 LAB — CBC WITH DIFFERENTIAL/PLATELET
Basophils Absolute: 0.1 10*3/uL (ref 0.0–0.1)
EOS%: 3.2 % (ref 0.0–7.0)
Eosinophils Absolute: 0.3 10*3/uL (ref 0.0–0.5)
HCT: 49.6 % (ref 38.4–49.9)
HGB: 16.6 g/dL (ref 13.0–17.1)
MCH: 30.3 pg (ref 27.2–33.4)
NEUT%: 75.7 % — ABNORMAL HIGH (ref 39.0–75.0)
lymph#: 1.4 10*3/uL (ref 0.9–3.3)

## 2013-01-24 LAB — COMPREHENSIVE METABOLIC PANEL (CC13)
Albumin: 3.8 g/dL (ref 3.5–5.0)
CO2: 21 mEq/L — ABNORMAL LOW (ref 22–29)
Calcium: 9.6 mg/dL (ref 8.4–10.4)
Chloride: 103 mEq/L (ref 98–107)
Glucose: 208 mg/dl — ABNORMAL HIGH (ref 70–99)
Potassium: 3.9 mEq/L (ref 3.5–5.1)
Sodium: 138 mEq/L (ref 136–145)
Total Bilirubin: 0.91 mg/dL (ref 0.20–1.20)
Total Protein: 7 g/dL (ref 6.4–8.3)

## 2013-01-24 MED ORDER — IOHEXOL 300 MG/ML  SOLN
100.0000 mL | Freq: Once | INTRAMUSCULAR | Status: AC | PRN
  Administered 2013-01-24: 100 mL via INTRAVENOUS

## 2013-01-26 ENCOUNTER — Telehealth: Payer: Self-pay | Admitting: *Deleted

## 2013-01-26 NOTE — Telephone Encounter (Signed)
Pt returned call & was given information per Dr Cyndie Chime that CT was negative for lymphoma.  Pt expressed appreciation.

## 2013-01-26 NOTE — Telephone Encounter (Signed)
Message copied by Sabino Snipes on Wed Jan 26, 2013  2:43 PM ------      Message from: Levert Feinstein      Created: Tue Jan 25, 2013  8:46 PM       Call pt, CT negative for lymphoma ------

## 2013-01-31 ENCOUNTER — Encounter (INDEPENDENT_AMBULATORY_CARE_PROVIDER_SITE_OTHER): Payer: Self-pay | Admitting: Surgery

## 2013-01-31 ENCOUNTER — Ambulatory Visit (INDEPENDENT_AMBULATORY_CARE_PROVIDER_SITE_OTHER): Payer: Medicaid Other | Admitting: Surgery

## 2013-01-31 VITALS — BP 128/76 | HR 70 | Temp 97.1°F | Resp 14 | Ht 67.0 in | Wt 256.4 lb

## 2013-01-31 DIAGNOSIS — C8299 Follicular lymphoma, unspecified, extranodal and solid organ sites: Secondary | ICD-10-CM

## 2013-01-31 DIAGNOSIS — C8280 Other types of follicular lymphoma, unspecified site: Secondary | ICD-10-CM
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
## 2013-02-10 ENCOUNTER — Ambulatory Visit (INDEPENDENT_AMBULATORY_CARE_PROVIDER_SITE_OTHER): Payer: Medicaid Other | Admitting: Cardiology

## 2013-02-10 ENCOUNTER — Encounter: Payer: Self-pay | Admitting: Cardiology

## 2013-02-10 VITALS — BP 151/94 | HR 89 | Wt 258.0 lb

## 2013-02-10 DIAGNOSIS — E785 Hyperlipidemia, unspecified: Secondary | ICD-10-CM

## 2013-02-10 DIAGNOSIS — Z720 Tobacco use: Secondary | ICD-10-CM

## 2013-02-10 DIAGNOSIS — I2699 Other pulmonary embolism without acute cor pulmonale: Secondary | ICD-10-CM

## 2013-02-10 DIAGNOSIS — F17200 Nicotine dependence, unspecified, uncomplicated: Secondary | ICD-10-CM

## 2013-02-10 DIAGNOSIS — I10 Essential (primary) hypertension: Secondary | ICD-10-CM

## 2013-02-10 DIAGNOSIS — I2510 Atherosclerotic heart disease of native coronary artery without angina pectoris: Secondary | ICD-10-CM

## 2013-02-10 NOTE — Assessment & Plan Note (Signed)
Patient counseled on discontinuing. 

## 2013-02-10 NOTE — Assessment & Plan Note (Signed)
Continue diet. Intolerant to statins. 
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
## 2013-02-10 NOTE — Patient Instructions (Addendum)

## 2013-02-10 NOTE — Assessment & Plan Note (Signed)
Patient has not been taking his xeralto since January. He notes some dyspnea but this appears to be more chronic and not worsened. I will plan to repeat an echocardiogram for LV and RV function. He will discuss with Dr. Cyndie Chime about need to resume anticoagulation.

## 2013-02-10 NOTE — Assessment & Plan Note (Signed)
Blood pressure controlled. Continue present medications. 

## 2013-02-10 NOTE — Assessment & Plan Note (Signed)
Continue aspirin. Intolerant to statins. 

## 2013-02-14 ENCOUNTER — Ambulatory Visit (HOSPITAL_BASED_OUTPATIENT_CLINIC_OR_DEPARTMENT_OTHER): Payer: Medicaid Other

## 2013-02-14 ENCOUNTER — Telehealth: Payer: Self-pay | Admitting: Oncology

## 2013-02-14 ENCOUNTER — Ambulatory Visit (HOSPITAL_BASED_OUTPATIENT_CLINIC_OR_DEPARTMENT_OTHER): Payer: Medicaid Other | Admitting: Oncology

## 2013-02-14 VITALS — BP 149/91 | HR 84 | Temp 97.0°F | Resp 18 | Ht 67.0 in | Wt 260.1 lb

## 2013-02-14 DIAGNOSIS — I2699 Other pulmonary embolism without acute cor pulmonale: Secondary | ICD-10-CM

## 2013-02-14 DIAGNOSIS — Z9119 Patient's noncompliance with other medical treatment and regimen: Secondary | ICD-10-CM

## 2013-02-14 DIAGNOSIS — N281 Cyst of kidney, acquired: Secondary | ICD-10-CM

## 2013-02-14 DIAGNOSIS — C8294 Follicular lymphoma, unspecified, lymph nodes of axilla and upper limb: Secondary | ICD-10-CM

## 2013-02-14 DIAGNOSIS — C8280 Other types of follicular lymphoma, unspecified site: Secondary | ICD-10-CM

## 2013-02-14 DIAGNOSIS — Z91199 Patient's noncompliance with other medical treatment and regimen due to unspecified reason: Secondary | ICD-10-CM

## 2013-02-14 DIAGNOSIS — I2510 Atherosclerotic heart disease of native coronary artery without angina pectoris: Secondary | ICD-10-CM

## 2013-02-14 DIAGNOSIS — I82409 Acute embolism and thrombosis of unspecified deep veins of unspecified lower extremity: Secondary | ICD-10-CM

## 2013-02-14 DIAGNOSIS — D6859 Other primary thrombophilia: Secondary | ICD-10-CM

## 2013-02-14 DIAGNOSIS — I82402 Acute embolism and thrombosis of unspecified deep veins of left lower extremity: Secondary | ICD-10-CM

## 2013-02-14 DIAGNOSIS — E119 Type 2 diabetes mellitus without complications: Secondary | ICD-10-CM

## 2013-02-14 LAB — CBC WITH DIFFERENTIAL/PLATELET
Basophils Absolute: 0.1 10*3/uL (ref 0.0–0.1)
EOS%: 2.5 % (ref 0.0–7.0)
Eosinophils Absolute: 0.3 10*3/uL (ref 0.0–0.5)
HGB: 16.5 g/dL (ref 13.0–17.1)
NEUT#: 8.5 10*3/uL — ABNORMAL HIGH (ref 1.5–6.5)
RBC: 5.36 10*6/uL (ref 4.20–5.82)
RDW: 12.9 % (ref 11.0–14.6)
WBC: 11.3 10*3/uL — ABNORMAL HIGH (ref 4.0–10.3)
lymph#: 1.7 10*3/uL (ref 0.9–3.3)
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
## 2013-02-16 LAB — PROTEIN S, ANTIGEN, FREE: Protein S Ag, Free: 83 % normal (ref 57–171)

## 2013-02-16 LAB — D-DIMER, QUANTITATIVE: D-Dimer, Quant: 0.53 ug/mL-FEU — ABNORMAL HIGH (ref 0.00–0.48)

## 2013-02-16 LAB — LUPUS ANTICOAGULANT PANEL: PTT Lupus Anticoagulant: 32.7 secs (ref 28.0–43.0)

## 2013-02-18 ENCOUNTER — Encounter (HOSPITAL_BASED_OUTPATIENT_CLINIC_OR_DEPARTMENT_OTHER): Payer: Self-pay | Admitting: *Deleted

## 2013-02-18 ENCOUNTER — Ambulatory Visit (HOSPITAL_COMMUNITY): Payer: Medicaid Other | Attending: Internal Medicine | Admitting: Radiology

## 2013-02-18 DIAGNOSIS — Z720 Tobacco use: Secondary | ICD-10-CM

## 2013-02-18 DIAGNOSIS — C8589 Other specified types of non-Hodgkin lymphoma, extranodal and solid organ sites: Secondary | ICD-10-CM | POA: Insufficient documentation

## 2013-02-18 DIAGNOSIS — Z8673 Personal history of transient ischemic attack (TIA), and cerebral infarction without residual deficits: Secondary | ICD-10-CM | POA: Insufficient documentation

## 2013-02-18 DIAGNOSIS — I10 Essential (primary) hypertension: Secondary | ICD-10-CM | POA: Insufficient documentation

## 2013-02-18 DIAGNOSIS — E785 Hyperlipidemia, unspecified: Secondary | ICD-10-CM | POA: Insufficient documentation

## 2013-02-18 DIAGNOSIS — E669 Obesity, unspecified: Secondary | ICD-10-CM | POA: Insufficient documentation

## 2013-02-18 DIAGNOSIS — E119 Type 2 diabetes mellitus without complications: Secondary | ICD-10-CM | POA: Insufficient documentation

## 2013-02-18 DIAGNOSIS — F17200 Nicotine dependence, unspecified, uncomplicated: Secondary | ICD-10-CM | POA: Insufficient documentation

## 2013-02-18 DIAGNOSIS — Z86718 Personal history of other venous thrombosis and embolism: Secondary | ICD-10-CM | POA: Insufficient documentation

## 2013-02-18 DIAGNOSIS — Z86711 Personal history of pulmonary embolism: Secondary | ICD-10-CM | POA: Insufficient documentation

## 2013-02-18 DIAGNOSIS — I2510 Atherosclerotic heart disease of native coronary artery without angina pectoris: Secondary | ICD-10-CM | POA: Insufficient documentation

## 2013-02-18 DIAGNOSIS — I2699 Other pulmonary embolism without acute cor pulmonale: Secondary | ICD-10-CM

## 2013-02-18 NOTE — Progress Notes (Signed)
02/18/13 1337  OBSTRUCTIVE SLEEP APNEA  Have you ever been diagnosed with sleep apnea through a sleep study? No  Do you snore loudly (loud enough to be heard through closed doors)?  0  Do you often feel tired, fatigued, or sleepy during the daytime? 1  Has anyone observed you stop breathing during your sleep? 0  Do you have, or are you being treated for high blood pressure? 1  BMI more than 35 kg/m2? 1  Age over 53 years old? 1  Gender: 1  Obstructive Sleep Apnea Score 5  Score 4 or greater  Results sent to PCP [facility], [place], [place])

## 2013-02-18 NOTE — Progress Notes (Signed)
Echocardiogram performed.  

## 2013-02-18 NOTE — Pre-Procedure Instructions (Signed)
To come for BMET 

## 2013-02-21 NOTE — Pre-Procedure Instructions (Signed)
Hx. and cardiology note reviewed with Dr. Ivin Booty; pt. OK to come for surgery.

## 2013-02-22 ENCOUNTER — Encounter (HOSPITAL_BASED_OUTPATIENT_CLINIC_OR_DEPARTMENT_OTHER)
Admission: RE | Admit: 2013-02-22 | Discharge: 2013-02-22 | Disposition: A | Discharge status: Home / Self Care | Payer: Medicaid Other | Source: Ambulatory Visit | Attending: Surgery | Admitting: Surgery

## 2013-02-22 LAB — BASIC METABOLIC PANEL
BUN: 7 mg/dL (ref 6–23)
Calcium: 9.8 mg/dL (ref 8.4–10.5)
Creatinine, Ser: 0.8 mg/dL (ref 0.50–1.35)
GFR calc Af Amer: >90 mL/min (ref >90)

## 2013-02-25 ENCOUNTER — Encounter (HOSPITAL_BASED_OUTPATIENT_CLINIC_OR_DEPARTMENT_OTHER): Payer: Self-pay | Admitting: *Deleted

## 2013-02-25 ENCOUNTER — Ambulatory Visit (HOSPITAL_BASED_OUTPATIENT_CLINIC_OR_DEPARTMENT_OTHER): Payer: Medicaid Other | Admitting: *Deleted

## 2013-02-25 ENCOUNTER — Ambulatory Visit (HOSPITAL_BASED_OUTPATIENT_CLINIC_OR_DEPARTMENT_OTHER)
Admission: RE | Admit: 2013-02-25 | Discharge: 2013-02-25 | Disposition: A | Discharge status: Home / Self Care | Payer: Medicaid Other | Source: Ambulatory Visit | Attending: Surgery | Admitting: Surgery

## 2013-02-25 ENCOUNTER — Encounter (HOSPITAL_BASED_OUTPATIENT_CLINIC_OR_DEPARTMENT_OTHER)
Admission: RE | Disposition: A | Discharge status: Home / Self Care | Payer: Self-pay | Source: Ambulatory Visit | Attending: Surgery

## 2013-02-25 DIAGNOSIS — G4730 Sleep apnea, unspecified: Secondary | ICD-10-CM | POA: Insufficient documentation

## 2013-02-25 DIAGNOSIS — E785 Hyperlipidemia, unspecified: Secondary | ICD-10-CM | POA: Insufficient documentation

## 2013-02-25 DIAGNOSIS — E119 Type 2 diabetes mellitus without complications: Secondary | ICD-10-CM | POA: Insufficient documentation

## 2013-02-25 DIAGNOSIS — I428 Other cardiomyopathies: Secondary | ICD-10-CM | POA: Insufficient documentation

## 2013-02-25 DIAGNOSIS — M109 Gout, unspecified: Secondary | ICD-10-CM | POA: Insufficient documentation

## 2013-02-25 DIAGNOSIS — Z452 Encounter for adjustment and management of vascular access device: Secondary | ICD-10-CM | POA: Insufficient documentation

## 2013-02-25 DIAGNOSIS — F17200 Nicotine dependence, unspecified, uncomplicated: Secondary | ICD-10-CM | POA: Insufficient documentation

## 2013-02-25 DIAGNOSIS — Z9221 Personal history of antineoplastic chemotherapy: Secondary | ICD-10-CM | POA: Insufficient documentation

## 2013-02-25 DIAGNOSIS — Z7901 Long term (current) use of anticoagulants: Secondary | ICD-10-CM | POA: Insufficient documentation

## 2013-02-25 DIAGNOSIS — C8584 Other specified types of non-Hodgkin lymphoma, lymph nodes of axilla and upper limb: Secondary | ICD-10-CM | POA: Insufficient documentation

## 2013-02-25 DIAGNOSIS — Z923 Personal history of irradiation: Secondary | ICD-10-CM | POA: Insufficient documentation

## 2013-02-25 DIAGNOSIS — I252 Old myocardial infarction: Secondary | ICD-10-CM | POA: Insufficient documentation

## 2013-02-25 DIAGNOSIS — I10 Essential (primary) hypertension: Secondary | ICD-10-CM | POA: Insufficient documentation

## 2013-02-25 DIAGNOSIS — Z79899 Other long term (current) drug therapy: Secondary | ICD-10-CM | POA: Insufficient documentation

## 2013-02-25 DIAGNOSIS — C8594 Non-Hodgkin lymphoma, unspecified, lymph nodes of axilla and upper limb: Secondary | ICD-10-CM

## 2013-02-25 DIAGNOSIS — I2510 Atherosclerotic heart disease of native coronary artery without angina pectoris: Secondary | ICD-10-CM | POA: Insufficient documentation

## 2013-02-25 DIAGNOSIS — Z8673 Personal history of transient ischemic attack (TIA), and cerebral infarction without residual deficits: Secondary | ICD-10-CM | POA: Insufficient documentation

## 2013-02-25 HISTORY — DX: Personal history of other (healed) physical injury and trauma: Z87.828

## 2013-02-25 HISTORY — PX: PORT-A-CATH REMOVAL: SHX5289

## 2013-02-25 HISTORY — DX: Personal history of non-Hodgkin lymphomas: Z85.72

## 2013-02-25 HISTORY — DX: Personal history of other diseases of the musculoskeletal system and connective tissue: Z87.39

## 2013-02-25 HISTORY — DX: Personal history of pulmonary embolism: Z86.711

## 2013-02-25 HISTORY — DX: Acute myocardial infarction, unspecified: I21.9

## 2013-02-25 HISTORY — DX: Personal history of other venous thrombosis and embolism: Z86.718

## 2013-02-25 LAB — GLUCOSE, CAPILLARY: Glucose-Capillary: 135 mg/dL — ABNORMAL HIGH (ref 70–99)

## 2013-02-25 SURGERY — REMOVAL PORT-A-CATH
Anesthesia: Monitor Anesthesia Care | Site: Chest | Laterality: Left | Wound class: Clean

## 2013-02-25 MED ORDER — BUPIVACAINE-EPINEPHRINE 0.25% -1:200000 IJ SOLN
INTRAMUSCULAR | Status: DC | PRN
  Administered 2013-02-25: 8 mL

## 2013-02-25 MED ORDER — PROPOFOL 10 MG/ML IV BOLUS
INTRAVENOUS | Status: DC | PRN
  Administered 2013-02-25 (×2): 20 mg via INTRAVENOUS
  Administered 2013-02-25: 30 mg via INTRAVENOUS

## 2013-02-25 MED ORDER — OXYCODONE HCL 5 MG PO TABS: 5.0000 mg | ORAL_TABLET | Freq: Once | ORAL | Status: DC | PRN

## 2013-02-25 MED ORDER — FENTANYL CITRATE 0.05 MG/ML IJ SOLN: 50.0000 ug | INTRAMUSCULAR | Status: DC | PRN

## 2013-02-25 MED ORDER — LIDOCAINE HCL (CARDIAC) 20 MG/ML IV SOLN
INTRAVENOUS | Status: DC | PRN
  Administered 2013-02-25: 25 mg via INTRAVENOUS

## 2013-02-25 MED ORDER — LACTATED RINGERS IV SOLN
INTRAVENOUS | Status: DC
  Administered 2013-02-25: 12:00:00 via INTRAVENOUS

## 2013-02-25 MED ORDER — CEFAZOLIN SODIUM-DEXTROSE 2-3 GM-% IV SOLR
2.0000 g | INTRAVENOUS | Status: AC
  Administered 2013-02-25: 2 g via INTRAVENOUS

## 2013-02-25 MED ORDER — MIDAZOLAM HCL 2 MG/2ML IJ SOLN: 1.0000 mg | INTRAMUSCULAR | Status: DC | PRN

## 2013-02-25 MED ORDER — ONDANSETRON HCL 4 MG/2ML IJ SOLN
INTRAMUSCULAR | Status: DC | PRN
  Administered 2013-02-25: 4 mg via INTRAVENOUS

## 2013-02-25 MED ORDER — FENTANYL CITRATE 0.05 MG/ML IJ SOLN
INTRAMUSCULAR | Status: DC | PRN
  Administered 2013-02-25: 100 ug via INTRAVENOUS

## 2013-02-25 MED ORDER — FENTANYL CITRATE 0.05 MG/ML IJ SOLN: 25.0000 ug | INTRAMUSCULAR | Status: DC | PRN

## 2013-02-25 MED ORDER — OXYCODONE HCL 5 MG/5ML PO SOLN: 5.0000 mg | Freq: Once | ORAL | Status: DC | PRN

## 2013-02-25 MED ORDER — MIDAZOLAM HCL 2 MG/ML PO SYRP: 12.0000 mg | ORAL_SOLUTION | Freq: Once | ORAL | Status: DC | PRN

## 2013-02-25 MED ORDER — MIDAZOLAM HCL 5 MG/5ML IJ SOLN
INTRAMUSCULAR | Status: DC | PRN
  Administered 2013-02-25: 1 mg via INTRAVENOUS

## 2013-02-25 SURGICAL SUPPLY — 43 items
APL SKNCLS STERI-STRIP NONHPOA (GAUZE/BANDAGES/DRESSINGS) ×1
APPLICATOR COTTON TIP 6IN STRL (MISCELLANEOUS) IMPLANT
BENZOIN TINCTURE PRP APPL 2/3 (GAUZE/BANDAGES/DRESSINGS) ×2 IMPLANT
BLADE HEX COATED 2.75 (ELECTRODE) ×2 IMPLANT
BLADE SURG 15 STRL LF DISP TIS (BLADE) ×1 IMPLANT
BLADE SURG 15 STRL SS (BLADE) ×2
BLADE SURG ROTATE 9660 (MISCELLANEOUS) ×1 IMPLANT
CANISTER SUCTION 1200CC (MISCELLANEOUS) IMPLANT
CHLORAPREP W/TINT 26ML (MISCELLANEOUS) ×2 IMPLANT
CLOTH BEACON ORANGE TIMEOUT ST (SAFETY) ×2 IMPLANT
COVER MAYO STAND STRL (DRAPES) ×2 IMPLANT
COVER TABLE BACK 60X90 (DRAPES) ×2 IMPLANT
DECANTER SPIKE VIAL GLASS SM (MISCELLANEOUS) ×2 IMPLANT
DRAPE PED LAPAROTOMY (DRAPES) ×2 IMPLANT
DRAPE UTILITY XL STRL (DRAPES) ×2 IMPLANT
DRSG TEGADERM 4X4.75 (GAUZE/BANDAGES/DRESSINGS) ×2 IMPLANT
ELECT REM PT RETURN 9FT ADLT (ELECTROSURGICAL) ×2
ELECTRODE REM PT RTRN 9FT ADLT (ELECTROSURGICAL) ×1 IMPLANT
GAUZE SPONGE 4X4 12PLY STRL LF (GAUZE/BANDAGES/DRESSINGS) IMPLANT
GLOVE BIO SURGEON STRL SZ 6.5 (GLOVE) ×1 IMPLANT
GLOVE BIO SURGEON STRL SZ7 (GLOVE) ×2 IMPLANT
GLOVE BIOGEL M 7.0 STRL (GLOVE) ×1 IMPLANT
GLOVE BIOGEL PI IND STRL 7.0 (GLOVE) IMPLANT
GLOVE BIOGEL PI IND STRL 7.5 (GLOVE) ×1 IMPLANT
GLOVE BIOGEL PI INDICATOR 7.0 (GLOVE) ×1
GLOVE BIOGEL PI INDICATOR 7.5 (GLOVE) ×1
GOWN PREVENTION PLUS XLARGE (GOWN DISPOSABLE) ×2 IMPLANT
NDL HYPO 25X1 1.5 SAFETY (NEEDLE) ×1 IMPLANT
NEEDLE HYPO 25X1 1.5 SAFETY (NEEDLE) ×2 IMPLANT
NS IRRIG 1000ML POUR BTL (IV SOLUTION) IMPLANT
PACK BASIN DAY SURGERY FS (CUSTOM PROCEDURE TRAY) ×2 IMPLANT
PENCIL BUTTON HOLSTER BLD 10FT (ELECTRODE) ×2 IMPLANT
SLEEVE SCD COMPRESS KNEE MED (MISCELLANEOUS) ×1 IMPLANT
SPONGE LAP 4X18 X RAY DECT (DISPOSABLE) ×2 IMPLANT
STRIP CLOSURE SKIN 1/2X4 (GAUZE/BANDAGES/DRESSINGS) ×2 IMPLANT
SUT MON AB 4-0 PC3 18 (SUTURE) ×2 IMPLANT
SUT VIC AB 3-0 SH 27 (SUTURE) ×2
SUT VIC AB 3-0 SH 27X BRD (SUTURE) ×1 IMPLANT
SYR CONTROL 10ML LL (SYRINGE) ×2 IMPLANT
TOWEL OR 17X24 6PK STRL BLUE (TOWEL DISPOSABLE) ×4 IMPLANT
TOWEL OR NON WOVEN STRL DISP B (DISPOSABLE) ×2 IMPLANT
TUBE CONNECTING 20X1/4 (TUBING) IMPLANT
YANKAUER SUCT BULB TIP NO VENT (SUCTIONS) IMPLANT

## 2013-02-25 NOTE — Op Note (Signed)
Pre-op Diagnosis:  Lymphoma - s/p chemotherapy Post-op Diagnosis:  Same Procedure:  Left subclavian vein port removal Surgeon:  Adream Parzych K. Anesthesia:  Local MAC Indications:  52 yo male with Stage 1B lymphoma s/p chemotherapy and radiation.  He presents now for port removal.  Description of procedure:  The patient was brought to the operating room and placed in a supine position on the OR table.  His left chest was prepped with Chloraprep and draped in sterile fashion.  We anesthetized the area around the port with 0.25% Marcaine.  The incision was opened and we dissected down to the port.  The catheter was removed from the subclavian vein and pressure was held for several minutes.  The two prolene sutures holding the port in place were removed and the port was removed.  The fibrin sheath was excised.  We closed the wound with 3-0 Vicryl and 4-0 Monocryl.  Steri-strips and a clean dressing were applied.  The patient was brought to the recovery room in stable condition.    Wilmon Arms. Corliss Skains, MD, [facility]  General/ Trauma Surgery  02/25/2013 1:06 PM

## 2013-02-25 NOTE — Transfer of Care (Signed)
Immediate Anesthesia Transfer of Care Note  Patient: William Calderon  Procedure(s) Performed: Procedure(s):  LEFT  REMOVAL PORT-A-CATH (Left)  Patient Location: PACU  Anesthesia Type:MAC  Level of Consciousness: awake, alert  and oriented  Airway & Oxygen Therapy: Patient Spontanous Breathing and Patient connected to face mask oxygen  Post-op Assessment: Report given to PACU RN, Post -op Vital signs reviewed and stable and Patient moving all extremities  Post vital signs: Reviewed and stable  Complications: No apparent anesthesia complications

## 2013-02-25 NOTE — Anesthesia Postprocedure Evaluation (Signed)
  Anesthesia Post-op Note  Patient: William Calderon  Procedure(s) Performed: Procedure(s):  LEFT  REMOVAL PORT-A-CATH (Left)  Patient Location: PACU  Anesthesia Type:MAC  Level of Consciousness: awake  Airway and Oxygen Therapy: Patient Spontanous Breathing  Post-op Pain: none  Post-op Assessment: Post-op Vital signs reviewed  Post-op Vital Signs: stable  Complications: No apparent anesthesia complications

## 2013-02-25 NOTE — Interval H&P Note (Signed)
History and Physical Interval Note:  02/25/2013 11:58 AM  William Calderon  has presented today for surgery, with the diagnosis of completed chemo/Lymphoma  The various methods of treatment have been discussed with the patient and family. After consideration of risks, benefits and other options for treatment, the patient has consented to  Procedure(s):  LEFT  REMOVAL PORT-A-CATH (Left) as a surgical intervention .  The patient's history has been reviewed, patient examined, no change in status, stable for surgery.  I have reviewed the patient's chart and labs.  Questions were answered to the patient's satisfaction.     Feather Berrie K.
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
## 2013-02-25 NOTE — Anesthesia Procedure Notes (Addendum)
Procedure Name: MAC Date/Time: 02/25/2013 12:39 PM Performed by: Meyer Russel Pre-anesthesia Checklist: Patient identified, Emergency Drugs available, Suction available and Patient being monitored Patient Re-evaluated:Patient Re-evaluated prior to inductionOxygen Delivery Method: Simple face mask Preoxygenation: Pre-oxygenation with 100% oxygen Intubation Type: IV induction

## 2013-02-25 NOTE — Anesthesia Preprocedure Evaluation (Addendum)
Anesthesia Evaluation  Patient identified by MRN, date of birth, ID band Patient awake    Reviewed: Allergy & Precautions, H&P , NPO status , Patient's Chart, lab work & pertinent test results  Airway Mallampati: I TM Distance: >3 FB Neck ROM: Full    Dental   Pulmonary sleep apnea , COPDCurrent Smoker,  lymphoma - rhonchi  + decreased breath sounds      Cardiovascular hypertension, + CAD, + Past MI, + CABG and + Peripheral Vascular Disease Rhythm:Regular Rate:Normal     Neuro/Psych CVA    GI/Hepatic   Endo/Other  diabetes  Renal/GU      Musculoskeletal   Abdominal (+) + obese,   Peds  Hematology   Anesthesia Other Findings   Reproductive/Obstetrics                          Anesthesia Physical Anesthesia Plan  ASA: III  Anesthesia Plan: MAC   Post-op Pain Management:    Induction: Intravenous  Airway Management Planned: Natural Airway  Additional Equipment:   Intra-op Plan:   Post-operative Plan:   Informed Consent: I have reviewed the patients History and Physical, chart, labs and discussed the procedure including the risks, benefits and alternatives for the proposed anesthesia with the patient or authorized representative who has indicated his/her understanding and acceptance.   Dental advisory given  Plan Discussed with: CRNA and Surgeon  Anesthesia Plan Comments:         Anesthesia Quick Evaluation

## 2013-03-01 ENCOUNTER — Encounter (HOSPITAL_BASED_OUTPATIENT_CLINIC_OR_DEPARTMENT_OTHER): Payer: Self-pay | Admitting: Surgery

## 2013-04-18 ENCOUNTER — Telehealth: Payer: Self-pay | Admitting: Oncology

## 2013-04-18 NOTE — Telephone Encounter (Signed)
Sent medical records to [facility].

## 2013-07-12 ENCOUNTER — Other Ambulatory Visit: Payer: Medicaid Other

## 2013-07-12 ENCOUNTER — Encounter (HOSPITAL_COMMUNITY): Payer: Self-pay

## 2013-07-12 ENCOUNTER — Ambulatory Visit (HOSPITAL_COMMUNITY)
Admission: RE | Admit: 2013-07-12 | Discharge: 2013-07-12 | Disposition: A | Discharge status: Home / Self Care | Payer: Medicaid Other | Source: Ambulatory Visit | Attending: Oncology | Admitting: Oncology

## 2013-07-12 ENCOUNTER — Other Ambulatory Visit (HOSPITAL_BASED_OUTPATIENT_CLINIC_OR_DEPARTMENT_OTHER): Payer: Medicaid Other | Admitting: Lab

## 2013-07-12 DIAGNOSIS — N281 Cyst of kidney, acquired: Secondary | ICD-10-CM

## 2013-07-12 DIAGNOSIS — Z9221 Personal history of antineoplastic chemotherapy: Secondary | ICD-10-CM | POA: Insufficient documentation

## 2013-07-12 DIAGNOSIS — I2510 Atherosclerotic heart disease of native coronary artery without angina pectoris: Secondary | ICD-10-CM | POA: Insufficient documentation

## 2013-07-12 DIAGNOSIS — C8299 Follicular lymphoma, unspecified, extranodal and solid organ sites: Secondary | ICD-10-CM

## 2013-07-12 DIAGNOSIS — C8280 Other types of follicular lymphoma, unspecified site: Secondary | ICD-10-CM

## 2013-07-12 DIAGNOSIS — C8589 Other specified types of non-Hodgkin lymphoma, extranodal and solid organ sites: Secondary | ICD-10-CM | POA: Insufficient documentation

## 2013-07-12 DIAGNOSIS — K7689 Other specified diseases of liver: Secondary | ICD-10-CM | POA: Insufficient documentation

## 2013-07-12 LAB — CBC WITH DIFFERENTIAL/PLATELET
Basophils Absolute: 0.1 10*3/uL (ref 0.0–0.1)
Eosinophils Absolute: 0.3 10*3/uL (ref 0.0–0.5)
HGB: 16.3 g/dL (ref 13.0–17.1)
LYMPH%: 22.9 % (ref 14.0–49.0)
MCV: 90.6 fL (ref 79.3–98.0)
MONO#: 0.6 10*3/uL (ref 0.1–0.9)
NEUT#: 5.7 10*3/uL (ref 1.5–6.5)
Platelets: 195 10*3/uL (ref 140–400)
RBC: 5.22 10*6/uL (ref 4.20–5.82)
WBC: 8.6 10*3/uL (ref 4.0–10.3)

## 2013-07-12 LAB — LACTATE DEHYDROGENASE (CC13): LDH: 164 U/L (ref 125–245)

## 2013-07-12 LAB — COMPREHENSIVE METABOLIC PANEL (CC13)
ALT: 30 U/L (ref 0–55)
AST: 19 U/L (ref 5–34)
Albumin: 3.7 g/dL (ref 3.5–5.0)
Anion Gap: 11 mEq/L (ref 3–11)
BUN: 7.1 mg/dL (ref 7.0–26.0)
CO2: 22 mEq/L (ref 22–29)
Calcium: 9.3 mg/dL (ref 8.4–10.4)
Chloride: 107 mEq/L (ref 98–109)
Potassium: 3.8 mEq/L (ref 3.5–5.1)

## 2013-07-12 LAB — SEDIMENTATION RATE: Sed Rate: 4 mm/hr (ref 0–16)

## 2013-07-12 MED ORDER — IOHEXOL 300 MG/ML  SOLN
25.0000 mL | Freq: Once | INTRAMUSCULAR | Status: AC | PRN
  Administered 2013-07-12: 25 mL via ORAL

## 2013-07-13 ENCOUNTER — Telehealth: Payer: Self-pay | Admitting: *Deleted

## 2013-07-13 NOTE — Telephone Encounter (Signed)
Per MD, notified pt CT negative for lymphoma.  Pt verbalized understanding.

## 2013-07-13 NOTE — Telephone Encounter (Signed)
Message copied by Gala Romney on Wed Jul 13, 2013  3:56 PM ------      Message from: Levert Feinstein      Created: Wed Jul 13, 2013  1:04 PM       Call pt CT negative for lymphoma ------

## 2013-07-19 ENCOUNTER — Ambulatory Visit (HOSPITAL_BASED_OUTPATIENT_CLINIC_OR_DEPARTMENT_OTHER): Payer: Medicaid Other | Admitting: Oncology

## 2013-07-19 VITALS — BP 147/84 | HR 88 | Temp 98.4°F | Resp 20 | Ht 67.0 in | Wt 252.2 lb

## 2013-07-19 DIAGNOSIS — D6859 Other primary thrombophilia: Secondary | ICD-10-CM

## 2013-07-19 DIAGNOSIS — Z86718 Personal history of other venous thrombosis and embolism: Secondary | ICD-10-CM

## 2013-07-19 DIAGNOSIS — I82409 Acute embolism and thrombosis of unspecified deep veins of unspecified lower extremity: Secondary | ICD-10-CM

## 2013-07-19 DIAGNOSIS — R2231 Localized swelling, mass and lump, right upper limb: Secondary | ICD-10-CM

## 2013-07-19 DIAGNOSIS — Z86711 Personal history of pulmonary embolism: Secondary | ICD-10-CM

## 2013-07-19 DIAGNOSIS — C8280 Other types of follicular lymphoma, unspecified site: Secondary | ICD-10-CM

## 2013-07-19 DIAGNOSIS — I2699 Other pulmonary embolism without acute cor pulmonale: Secondary | ICD-10-CM

## 2013-07-19 DIAGNOSIS — C8299 Follicular lymphoma, unspecified, extranodal and solid organ sites: Secondary | ICD-10-CM

## 2013-07-19 DIAGNOSIS — E119 Type 2 diabetes mellitus without complications: Secondary | ICD-10-CM

## 2013-07-19 NOTE — Progress Notes (Signed)
Hematology and Oncology Follow Up Visit  William Calderon [id number] 24-Feb-1961 52 y.o. 07/19/2013 5:58 PM   Principle Diagnosis: Encounter Diagnoses  Name Primary?  . Nodular high grade B-cell lymphoma Yes  . DVT of leg (deep venous thrombosis), unspecified laterality   . Antithrombin III deficiency   . Pulmonary embolism and infarction   . Axillary mass, right      Interim History:   Followup visit for this 52 year old man with history stage IB, grade 3, follicular center, cell non-Hodgkin's lymphoma presenting with an isolated large right axillary lymph node mass in April 2013. Due to advanced coronary artery disease with baseline ejection fraction 40%,  I treated him with a non-anthracycline containing regimen: Cytoxan, etoposide, vincristine, and prednisone plus Rituxan. He received 4 cycles between 03/09/2012 and 05/12/2012 and then received involved field radiation 30.6 gray between September 9 and 07/06/2012. He achieved a complete PET response. Please see my summary note dated 08/03/2012 for full details.  He developed a left lower extremity DVT and bilateral pulmonary emboli just a few weeks after completing chemotherapy. He was treated with Xarelto for approximately 6 months. He stopped without asking for any medical advice in January of this year.  He has had no interim medical problems. He  had a followup visit with his cardiologist in May 8. No medication changes were made and a followup appointment was scheduled for one year from now.  He has had no medical problems since his visit here in May. However he has a number of personal issues. He is out of work. He is going to have to apply for disability. His wife just lost her sister to metastatic colon cancer a few days ago. She was only 56.    Medications: reviewed  Allergies:  Allergies  Allergen Reactions  . Monosodium Glutamate Other (See Comments)    HEADACHE, VISION LOSS    Review of Systems: Constitutional:    No anorexia, weight loss, or fever HEENT no sore throat Respiratory: No cough or dyspnea Cardiovascular:  No chest pain or palpitations Gastrointestinal: No abdominal pain or change in bowel habit Genito-Urinary: No urinary tract symptoms Musculoskeletal: Recent flareup of chronic back pain Neurologic: No headache or change in vision Skin: No rash   Remaining ROS negative.   Physical Exam: Blood pressure 147/84, pulse 88, temperature 98.4 F (36.9 C), temperature source Oral, resp. rate 20, height 5\' 7"  (1.702 m), weight 252 lb 3.2 oz (114.397 kg). Wt Readings from Last 3 Encounters:  07/19/13 252 lb 3.2 oz (114.397 kg)  02/18/13 260 lb (117.935 kg)  02/18/13 260 lb (117.935 kg)     General appearance: Well-nourished Caucasian man [name]: Pharynx no erythema or exudate. No thyromegaly Lymph nodes: No cervical, supraclavicular, axillary, or inguinal adenopathy Breasts: Lungs: Clear to auscultation resonant to percussion Heart: Regular rhythm no murmur Abdomen: Soft, nontender, no mass, no organomegaly Extremities: No edema, no calf tenderness Musculoskeletal: No joint deformities GU: Vascular: No carotid bruits, no cyanosis Neurologic: Mental status intact, cranial nerves grossly normal, motor strength 5 over 5, reflexes 1+ symmetric Skin:  Lab Results: CBC W/Diff    Component Value Date/Time   WBC 8.6 07/12/2013 0931   WBC 6.9 05/29/2012 1115   RBC 5.22 07/12/2013 0931   RBC 3.82* 05/29/2012 1115   HGB 16.3 07/12/2013 0931   HGB 16.7 02/25/2013 1211   HCT 47.3 07/12/2013 0931   HCT 33.2* 05/29/2012 1115   PLT 195 07/12/2013 0931   PLT 293 05/29/2012 1115  MCV 90.6 2013/07/18 0931   MCV 86.9 05/29/2012 1115   MCH 31.1 2013-07-18 0931   MCH 31.4 05/29/2012 1115   MCHC 34.4 2013/07/18 0931   MCHC 36.1* 05/29/2012 1115   RDW 12.9 2013/07/18 0931   RDW 14.2 05/29/2012 1115   LYMPHSABS 2.0 18-Jul-2013 0931   LYMPHSABS 1.0 05/26/2012 2040   MONOABS 0.6 Jul 18, 2013 0931   MONOABS 1.0  05/26/2012 2040   EOSABS 0.3 July 18, 2013 0931   EOSABS 0.0 05/26/2012 2040   BASOSABS 0.1 07-18-2013 0931   BASOSABS 0.1 05/26/2012 2040     Chemistry      Component Value Date/Time   NA 140 2013-07-18 0931   NA 138 02/22/2013 1430   K 3.8 2013/07/18 0931   K 4.0 02/22/2013 1430   CL 101 02/22/2013 1430   CL 103 01/24/2013 0806   CO2 22 July 18, 2013 0931   CO2 23 02/22/2013 1430   BUN 7.1 07/18/2013 0931   BUN 7 02/22/2013 1430   CREATININE 0.8 July 18, 2013 0931   CREATININE 0.80 02/22/2013 1430      Component Value Date/Time   CALCIUM 9.3 07-18-13 0931   CALCIUM 9.8 02/22/2013 1430   ALKPHOS 79 07-18-2013 0931   ALKPHOS 65 05/26/2012 2040   AST 19 07/18/13 0931   AST 19 05/26/2012 2040   ALT 30 2013/07/18 0931   ALT 19 05/26/2012 2040   BILITOT 0.71 July 18, 2013 0931   BILITOT 0.8 05/26/2012 2040       Radiological Studies: Ct Abdomen Wo Contrast  07/18/13   CLINICAL DATA:  Non-Hodgkin's lymphoma diagnosed in 2013. Subsequent treatment strategy. Chemotherapy completed.  EXAM: CT CHEST, ABDOMEN  WITHOUT CONTRAST  TECHNIQUE: Multidetector CT imaging of the chest, abdomen and pelvis was performed following the standard protocol without IV contrast.  COMPARISON:  CT 01/24/2013 item  FINDINGS: CT CHEST FINDINGS  No axillary or supraclavicular lymphadenopathy. No mediastinal hilar lymphadenopathy. No pericardial fluid. Esophagus is normal. Review of the lung parenchyma demonstrates no suspicious pulmonary nodules. Coronary calcifications noted. Bypass graft anatomy.  CT ABDOMEN AND PELVIS FINDINGS  There is a low-density lesion in the left hepatic lobe with the simple fluid attenuation. No additional lesions present on this non contrast exam. Post cholecystectomy. The pancreas and spleen are normal. The spleen is normal volume. Adrenal glands and kidneys are normal.  The stomach, small bowel, and limited view of the appendix and colon are unremarkable. Abdominal aorta is normal caliber. No retroperitoneal  periportal lymphadenopathy. Review of bone windows demonstrates no aggressive osseous lesions.  IMPRESSION: CT CHEST IMPRESSION  No evidence of lymphoma recurrence within the thorax.  CT ABDOMEN IMPRESSION  No evidence of lymphoma recurrence in the abdomen.   Electronically Signed   By: Genevive Bi M.D.   On: 18-Jul-2013 10:44   Ct Chest Wo Contrast     Impression: #1. Stage IB, IPI low risk, follicular grade 3, B-cell, non-Hodgkin's lymphoma  Complete remission with treatment outlined above. No evidence for new disease now out 18 months from diagnosis in April of 2013. I will reevaluate him again in 6 months with labs and CT scans.   #2. Acute left lower extremity DVT and bilateral pulmonary emboli. August 2013.  He took 6 months of Xarelto.  .  #4. Coronary artery disease status post MI status post bypass surgery   #5. Cardiomyopathy secondary to #4   #6. Type 2 diabetes poorly controlled due to noncompliance   #7. Essential hypertension.   #8. Hyperlipidemia.   #9. Tobacco addiction.   #  10. Anxiety and depression   #11. Cerebrovascular disease with prior stroke   #12. Gout   #13. Sleep apnea syndrome    CC:. Prudy Feeler, NP; Lonie Peak; Olga Millers; Rayetta Humphrey, MD 10/14/20145:58 PM

## 2013-07-20 ENCOUNTER — Telehealth: Payer: Self-pay | Admitting: *Deleted

## 2013-07-20 NOTE — Telephone Encounter (Signed)
Lm gv appts for 01/17/14 w/ labs@ 10:30am and ov@ 11am. Made the pt aware that cs will call will appts for his CT'S. i also made the pt aware that i will mail a letter/avs...td

## 2013-12-03 ENCOUNTER — Encounter: Payer: Self-pay | Admitting: Oncology

## 2014-01-12 ENCOUNTER — Other Ambulatory Visit: Payer: Self-pay | Admitting: Oncology

## 2014-01-12 ENCOUNTER — Encounter (HOSPITAL_COMMUNITY): Payer: Self-pay

## 2014-01-12 ENCOUNTER — Ambulatory Visit (HOSPITAL_COMMUNITY)
Admission: RE | Admit: 2014-01-12 | Discharge: 2014-01-12 | Disposition: A | Discharge status: Home / Self Care | Payer: BC Managed Care – PPO | Source: Ambulatory Visit | Attending: Oncology | Admitting: Oncology

## 2014-01-12 DIAGNOSIS — Z9221 Personal history of antineoplastic chemotherapy: Secondary | ICD-10-CM | POA: Insufficient documentation

## 2014-01-12 DIAGNOSIS — Z951 Presence of aortocoronary bypass graft: Secondary | ICD-10-CM | POA: Insufficient documentation

## 2014-01-12 DIAGNOSIS — C8589 Other specified types of non-Hodgkin lymphoma, extranodal and solid organ sites: Secondary | ICD-10-CM | POA: Insufficient documentation

## 2014-01-12 DIAGNOSIS — C8280 Other types of follicular lymphoma, unspecified site: Secondary | ICD-10-CM

## 2014-01-12 MED ORDER — IOHEXOL 300 MG/ML  SOLN
100.0000 mL | Freq: Once | INTRAMUSCULAR | Status: AC | PRN
  Administered 2014-01-12: 100 mL via INTRAVENOUS

## 2014-01-13 ENCOUNTER — Telehealth: Payer: Self-pay | Admitting: *Deleted

## 2014-01-13 NOTE — Telephone Encounter (Signed)
Message copied by Norma Fredrickson on Fri Jan 13, 2014 11:28 AM ------      Message from: [place], [place] P      Created: Fri Jan 13, 2014 11:07 AM                   ----- Message -----         From: Annia Belt, MD         Sent: 01/12/2014  11:18 AM           To: Ignacia Felling, RN, Jesse Fall, RN, #            Call pt: CT scans negative for lymphoma ------

## 2014-01-13 NOTE — Telephone Encounter (Signed)
Called and informed patient that CT scans are negative for lymphoma.  Per Dr. Beryle Beams.  Patient verbalized understanding.

## 2014-01-17 ENCOUNTER — Encounter: Payer: Self-pay | Admitting: Hematology and Oncology

## 2014-01-17 ENCOUNTER — Other Ambulatory Visit: Payer: Medicaid Other

## 2014-01-17 ENCOUNTER — Ambulatory Visit: Payer: Medicaid Other | Admitting: Oncology

## 2014-01-17 ENCOUNTER — Other Ambulatory Visit (HOSPITAL_BASED_OUTPATIENT_CLINIC_OR_DEPARTMENT_OTHER): Payer: BC Managed Care – PPO

## 2014-01-17 ENCOUNTER — Ambulatory Visit (HOSPITAL_BASED_OUTPATIENT_CLINIC_OR_DEPARTMENT_OTHER): Payer: BC Managed Care – PPO | Admitting: Hematology and Oncology

## 2014-01-17 VITALS — BP 153/87 | HR 74 | Temp 98.3°F | Resp 18 | Ht 67.0 in | Wt 252.5 lb

## 2014-01-17 DIAGNOSIS — I82402 Acute embolism and thrombosis of unspecified deep veins of left lower extremity: Secondary | ICD-10-CM

## 2014-01-17 DIAGNOSIS — D6859 Other primary thrombophilia: Secondary | ICD-10-CM

## 2014-01-17 DIAGNOSIS — F17200 Nicotine dependence, unspecified, uncomplicated: Secondary | ICD-10-CM

## 2014-01-17 DIAGNOSIS — C8280 Other types of follicular lymphoma, unspecified site: Secondary | ICD-10-CM

## 2014-01-17 DIAGNOSIS — IMO0001 Reserved for inherently not codable concepts without codable children: Secondary | ICD-10-CM

## 2014-01-17 DIAGNOSIS — C8299 Follicular lymphoma, unspecified, extranodal and solid organ sites: Secondary | ICD-10-CM

## 2014-01-17 DIAGNOSIS — I2699 Other pulmonary embolism without acute cor pulmonale: Secondary | ICD-10-CM

## 2014-01-17 LAB — CBC WITH DIFFERENTIAL/PLATELET
BASO%: 0.9 % (ref 0.0–2.0)
BASOS ABS: 0.1 10*3/uL (ref 0.0–0.1)
EOS%: 5 % (ref 0.0–7.0)
Eosinophils Absolute: 0.4 10*3/uL (ref 0.0–0.5)
HEMATOCRIT: 48 % (ref 38.4–49.9)
HGB: 17 g/dL (ref 13.0–17.1)
LYMPH%: 22.4 % (ref 14.0–49.0)
MCH: 31 pg (ref 27.2–33.4)
MCHC: 35.4 g/dL (ref 32.0–36.0)
MCV: 87.4 fL (ref 79.3–98.0)
MONO#: 0.6 10*3/uL (ref 0.1–0.9)
MONO%: 8.1 % (ref 0.0–14.0)
NEUT#: 4.5 10*3/uL (ref 1.5–6.5)
NEUT%: 63.6 % (ref 39.0–75.0)
RBC: 5.49 10*6/uL (ref 4.20–5.82)
RDW: 12.8 % (ref 11.0–14.6)
WBC: 7 10*3/uL (ref 4.0–10.3)
lymph#: 1.6 10*3/uL (ref 0.9–3.3)
nRBC: 1 % — ABNORMAL HIGH (ref 0–0)

## 2014-01-17 LAB — COMPREHENSIVE METABOLIC PANEL (CC13)
ALBUMIN: 3.8 g/dL (ref 3.5–5.0)
ALK PHOS: 81 U/L (ref 40–150)
ALT: 31 U/L (ref 0–55)
AST: 21 U/L (ref 5–34)
Anion Gap: 10 mEq/L (ref 3–11)
BUN: 7 mg/dL (ref 7.0–26.0)
CALCIUM: 9.5 mg/dL (ref 8.4–10.4)
CHLORIDE: 105 meq/L (ref 98–109)
CO2: 21 mEq/L — ABNORMAL LOW (ref 22–29)
Creatinine: 0.8 mg/dL (ref 0.7–1.3)
GLUCOSE: 269 mg/dL — AB (ref 70–140)
Potassium: 3.6 mEq/L (ref 3.5–5.1)
Sodium: 136 mEq/L (ref 136–145)
Total Bilirubin: 0.55 mg/dL (ref 0.20–1.20)
Total Protein: 6.7 g/dL (ref 6.4–8.3)

## 2014-01-17 LAB — LACTATE DEHYDROGENASE (CC13): LDH: 190 U/L (ref 125–245)
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
## 2014-01-18 LAB — ANTITHROMBIN III: ANTITHROMB III FUNC: 73 % — AB (ref 76–126)

## 2014-01-19 ENCOUNTER — Telehealth: Payer: Self-pay | Admitting: Hematology and Oncology

## 2014-01-19 NOTE — Telephone Encounter (Signed)
Gave pt appt for lab and MD for October 2015 °

## 2014-01-25 ENCOUNTER — Telehealth: Payer: Self-pay | Admitting: *Deleted

## 2014-01-25 NOTE — Telephone Encounter (Signed)
Message copied by Patton Salles on Wed Jan 25, 2014  2:34 PM ------      Message from: [facility], [place]: Tue Jan 24, 2014  4:52 PM      Regarding: abnormal blood test       Dr. Darnell Level. ordered repeat anti-thrombin 3 level due to history of blood clot.      Result came back low.      I recommend the patient to take 81 mg aspirin 2 tablets a day with food to prevent recurrence of blood clot.      ----- Message -----         From: Heath Lark, MD         Sent: 01/18/2014   9:06 PM           To: Heath Lark, MD                   ------

## 2014-01-25 NOTE — Telephone Encounter (Signed)
Pt states he is taking ASA 325 daily. Dr Alvy Bimler states patient should continue with ASA 325 daily.

## 2014-03-12 ENCOUNTER — Ambulatory Visit (HOSPITAL_COMMUNITY): Admit: 2014-03-12 | Payer: Self-pay | Admitting: Cardiovascular Disease

## 2014-03-12 ENCOUNTER — Encounter (HOSPITAL_COMMUNITY): Payer: Self-pay | Admitting: Emergency Medicine

## 2014-03-12 ENCOUNTER — Inpatient Hospital Stay (HOSPITAL_COMMUNITY)
Admission: EM | Admit: 2014-03-12 | Discharge: 2014-03-15 | DRG: 247 | Disposition: A | Discharge status: Home / Self Care | Payer: BC Managed Care – PPO | Attending: Cardiovascular Disease | Admitting: Cardiovascular Disease

## 2014-03-12 ENCOUNTER — Encounter (HOSPITAL_COMMUNITY)
Admission: EM | Disposition: A | Discharge status: Home / Self Care | Payer: BC Managed Care – PPO | Source: Home / Self Care | Attending: Cardiovascular Disease

## 2014-03-12 ENCOUNTER — Emergency Department (HOSPITAL_COMMUNITY): Payer: BC Managed Care – PPO

## 2014-03-12 DIAGNOSIS — I2109 ST elevation (STEMI) myocardial infarction involving other coronary artery of anterior wall: Principal | ICD-10-CM | POA: Diagnosis present

## 2014-03-12 DIAGNOSIS — E1165 Type 2 diabetes mellitus with hyperglycemia: Secondary | ICD-10-CM

## 2014-03-12 DIAGNOSIS — IMO0001 Reserved for inherently not codable concepts without codable children: Secondary | ICD-10-CM | POA: Diagnosis present

## 2014-03-12 DIAGNOSIS — F411 Generalized anxiety disorder: Secondary | ICD-10-CM | POA: Diagnosis present

## 2014-03-12 DIAGNOSIS — Z951 Presence of aortocoronary bypass graft: Secondary | ICD-10-CM

## 2014-03-12 DIAGNOSIS — Z79899 Other long term (current) drug therapy: Secondary | ICD-10-CM

## 2014-03-12 DIAGNOSIS — Z86718 Personal history of other venous thrombosis and embolism: Secondary | ICD-10-CM

## 2014-03-12 DIAGNOSIS — I517 Cardiomegaly: Secondary | ICD-10-CM

## 2014-03-12 DIAGNOSIS — I2129 ST elevation (STEMI) myocardial infarction involving other sites: Secondary | ICD-10-CM

## 2014-03-12 DIAGNOSIS — I70209 Unspecified atherosclerosis of native arteries of extremities, unspecified extremity: Secondary | ICD-10-CM | POA: Diagnosis present

## 2014-03-12 DIAGNOSIS — I2589 Other forms of chronic ischemic heart disease: Secondary | ICD-10-CM | POA: Diagnosis present

## 2014-03-12 DIAGNOSIS — I10 Essential (primary) hypertension: Secondary | ICD-10-CM | POA: Diagnosis present

## 2014-03-12 DIAGNOSIS — Z923 Personal history of irradiation: Secondary | ICD-10-CM

## 2014-03-12 DIAGNOSIS — I472 Ventricular tachycardia, unspecified: Secondary | ICD-10-CM | POA: Diagnosis present

## 2014-03-12 DIAGNOSIS — C8589 Other specified types of non-Hodgkin lymphoma, extranodal and solid organ sites: Secondary | ICD-10-CM | POA: Diagnosis present

## 2014-03-12 DIAGNOSIS — I255 Ischemic cardiomyopathy: Secondary | ICD-10-CM | POA: Diagnosis not present

## 2014-03-12 DIAGNOSIS — IMO0002 Reserved for concepts with insufficient information to code with codable children: Secondary | ICD-10-CM | POA: Diagnosis present

## 2014-03-12 DIAGNOSIS — I213 ST elevation (STEMI) myocardial infarction of unspecified site: Secondary | ICD-10-CM

## 2014-03-12 DIAGNOSIS — G4730 Sleep apnea, unspecified: Secondary | ICD-10-CM | POA: Diagnosis present

## 2014-03-12 DIAGNOSIS — Z86711 Personal history of pulmonary embolism: Secondary | ICD-10-CM

## 2014-03-12 DIAGNOSIS — E1151 Type 2 diabetes mellitus with diabetic peripheral angiopathy without gangrene: Secondary | ICD-10-CM | POA: Diagnosis present

## 2014-03-12 DIAGNOSIS — Z87891 Personal history of nicotine dependence: Secondary | ICD-10-CM

## 2014-03-12 DIAGNOSIS — I252 Old myocardial infarction: Secondary | ICD-10-CM

## 2014-03-12 DIAGNOSIS — I2582 Chronic total occlusion of coronary artery: Secondary | ICD-10-CM | POA: Diagnosis present

## 2014-03-12 DIAGNOSIS — I69998 Other sequelae following unspecified cerebrovascular disease: Secondary | ICD-10-CM

## 2014-03-12 DIAGNOSIS — Z9221 Personal history of antineoplastic chemotherapy: Secondary | ICD-10-CM

## 2014-03-12 DIAGNOSIS — E785 Hyperlipidemia, unspecified: Secondary | ICD-10-CM | POA: Diagnosis present

## 2014-03-12 DIAGNOSIS — I25810 Atherosclerosis of coronary artery bypass graft(s) without angina pectoris: Secondary | ICD-10-CM

## 2014-03-12 DIAGNOSIS — F17200 Nicotine dependence, unspecified, uncomplicated: Secondary | ICD-10-CM

## 2014-03-12 DIAGNOSIS — I2510 Atherosclerotic heart disease of native coronary artery without angina pectoris: Secondary | ICD-10-CM | POA: Diagnosis present

## 2014-03-12 DIAGNOSIS — Z720 Tobacco use: Secondary | ICD-10-CM | POA: Diagnosis present

## 2014-03-12 DIAGNOSIS — Z955 Presence of coronary angioplasty implant and graft: Secondary | ICD-10-CM

## 2014-03-12 DIAGNOSIS — R29898 Other symptoms and signs involving the musculoskeletal system: Secondary | ICD-10-CM | POA: Diagnosis present

## 2014-03-12 DIAGNOSIS — I4729 Other ventricular tachycardia: Secondary | ICD-10-CM

## 2014-03-12 DIAGNOSIS — Z7982 Long term (current) use of aspirin: Secondary | ICD-10-CM

## 2014-03-12 DIAGNOSIS — I219 Acute myocardial infarction, unspecified: Secondary | ICD-10-CM

## 2014-03-12 HISTORY — PX: LEFT HEART CATHETERIZATION WITH CORONARY ANGIOGRAM: SHX5451

## 2014-03-12 HISTORY — PX: PERCUTANEOUS CORONARY STENT INTERVENTION (PCI-S): SHX5485

## 2014-03-12 LAB — CBC WITH DIFFERENTIAL/PLATELET
BASOS ABS: 0.1 10*3/uL (ref 0.0–0.1)
Basophils Relative: 1 % (ref 0–1)
Eosinophils Absolute: 0.2 10*3/uL (ref 0.0–0.7)
Eosinophils Relative: 2 % (ref 0–5)
HCT: 48.4 % (ref 39.0–52.0)
Hemoglobin: 16.8 g/dL (ref 13.0–17.0)
LYMPHS PCT: 24 % (ref 12–46)
Lymphs Abs: 2.1 10*3/uL (ref 0.7–4.0)
MCH: 30.4 pg (ref 26.0–34.0)
MCHC: 34.7 g/dL (ref 30.0–36.0)
MCV: 87.7 fL (ref 78.0–100.0)
Monocytes Absolute: 0.7 10*3/uL (ref 0.1–1.0)
Monocytes Relative: 8 % (ref 3–12)
NEUTROS ABS: 5.8 10*3/uL (ref 1.7–7.7)
Neutrophils Relative %: 65 % (ref 43–77)
PLATELETS: 202 10*3/uL (ref 150–400)
RBC: 5.52 MIL/uL (ref 4.22–5.81)
RDW: 12.8 % (ref 11.5–15.5)
WBC: 9 10*3/uL (ref 4.0–10.5)

## 2014-03-12 LAB — COMPREHENSIVE METABOLIC PANEL
ALT: 25 U/L (ref 0–53)
AST: 19 U/L (ref 0–37)
Albumin: 4.1 g/dL (ref 3.5–5.2)
Alkaline Phosphatase: 87 U/L (ref 39–117)
BILIRUBIN TOTAL: 0.5 mg/dL (ref 0.3–1.2)
BUN: 11 mg/dL (ref 6–23)
CO2: 23 meq/L (ref 19–32)
Calcium: 10 mg/dL (ref 8.4–10.5)
Chloride: 96 mEq/L (ref 96–112)
Creatinine, Ser: 0.73 mg/dL (ref 0.50–1.35)
GFR calc Af Amer: >90 mL/min (ref >90)
GFR calc non Af Amer: >90 mL/min (ref >90)
Glucose, Bld: 293 mg/dL — ABNORMAL HIGH (ref 70–99)
Potassium: 3.4 mEq/L — ABNORMAL LOW (ref 3.7–5.3)
SODIUM: 136 meq/L — AB (ref 137–147)
Total Protein: 7.4 g/dL (ref 6.0–8.3)

## 2014-03-12 LAB — GLUCOSE, CAPILLARY
GLUCOSE-CAPILLARY: 250 mg/dL — AB (ref 70–99)
Glucose-Capillary: 251 mg/dL — ABNORMAL HIGH (ref 70–99)
Glucose-Capillary: 230 mg/dL — ABNORMAL HIGH (ref 70–99)

## 2014-03-12 LAB — I-STAT CHEM 8, ED
BUN: 11 mg/dL (ref 6–23)
CHLORIDE: 102 meq/L (ref 96–112)
Calcium, Ion: 1.2 mmol/L (ref 1.12–1.23)
Creatinine, Ser: 0.8 mg/dL (ref 0.50–1.35)
GLUCOSE: 288 mg/dL — AB (ref 70–99)
HCT: 51 % (ref 39.0–52.0)
Hemoglobin: 17.3 g/dL — ABNORMAL HIGH (ref 13.0–17.0)
Potassium: 3.7 mEq/L (ref 3.7–5.3)
Sodium: 138 mEq/L (ref 137–147)
TCO2: 26 mmol/L (ref 0–100)

## 2014-03-12 LAB — HEMOGLOBIN A1C
Hgb A1c MFr Bld: 9.3 % — ABNORMAL HIGH (ref <5.7)
Mean Plasma Glucose: 220 mg/dL — ABNORMAL HIGH (ref <117)

## 2014-03-12 LAB — PROTIME-INR
INR: 0.95 (ref 0.00–1.49)
PROTHROMBIN TIME: 12.5 s (ref 11.6–15.2)

## 2014-03-12 LAB — I-STAT TROPONIN, ED: Troponin i, poc: 0.03 ng/mL (ref 0.00–0.08)

## 2014-03-12 LAB — TSH: TSH: 0.915 u[IU]/mL (ref 0.350–4.500)

## 2014-03-12 LAB — TROPONIN I

## 2014-03-12 LAB — APTT: aPTT: 27 seconds (ref 24–37)

## 2014-03-12 LAB — MRSA PCR SCREENING: MRSA by PCR: NEGATIVE

## 2014-03-12 LAB — POCT ACTIVATED CLOTTING TIME: Activated Clotting Time: 149 seconds

## 2014-03-12 LAB — D-DIMER, QUANTITATIVE (NOT AT ARMC): D DIMER QUANT: 0.65 ug{FEU}/mL — AB (ref 0.00–0.48)

## 2014-03-12 SURGERY — LEFT HEART CATHETERIZATION WITH CORONARY ANGIOGRAM
Anesthesia: Choice | Laterality: Bilateral

## 2014-03-12 MED ORDER — CLOPIDOGREL BISULFATE 75 MG PO TABS
75.0000 mg | ORAL_TABLET | Freq: Every day | ORAL | Status: DC
  Administered 2014-03-12 – 2014-03-15 (×4): 75 mg via ORAL
  Filled 2014-03-12 (×6): qty 1

## 2014-03-12 MED ORDER — ASPIRIN 325 MG PO TABS: 325.0000 mg | ORAL_TABLET | Freq: Every day | ORAL | Status: DC

## 2014-03-12 MED ORDER — NITROGLYCERIN 2 % TD OINT
1.0000 [in_us] | TOPICAL_OINTMENT | Freq: Once | TRANSDERMAL | Status: AC
  Administered 2014-03-12: 1 [in_us] via TOPICAL
  Filled 2014-03-12: qty 30

## 2014-03-12 MED ORDER — SODIUM CHLORIDE 0.9 % IV SOLN
20.0000 mL | INTRAVENOUS | Status: DC
  Administered 2014-03-12: 20 mL via INTRAVENOUS

## 2014-03-12 MED ORDER — METFORMIN HCL 500 MG PO TABS
500.0000 mg | ORAL_TABLET | Freq: Two times a day (BID) | ORAL | Status: DC
  Filled 2014-03-12 (×2): qty 1

## 2014-03-12 MED ORDER — HEPARIN SODIUM (PORCINE) 5000 UNIT/ML IJ SOLN
4000.0000 [IU] | Freq: Once | INTRAMUSCULAR | Status: AC
  Administered 2014-03-12: 4000 [IU] via INTRAVENOUS
  Filled 2014-03-12: qty 0.8

## 2014-03-12 MED ORDER — ALPRAZOLAM 0.5 MG PO TABS
0.5000 mg | ORAL_TABLET | Freq: Two times a day (BID) | ORAL | Status: DC | PRN
  Administered 2014-03-12: 0.5 mg via ORAL
  Filled 2014-03-12: qty 1

## 2014-03-12 MED ORDER — INSULIN ASPART 100 UNIT/ML ~~LOC~~ SOLN: 0.0000 [IU] | Freq: Three times a day (TID) | SUBCUTANEOUS | Status: DC

## 2014-03-12 MED ORDER — AMLODIPINE BESYLATE 10 MG PO TABS
10.0000 mg | ORAL_TABLET | Freq: Every day | ORAL | Status: DC
  Administered 2014-03-12 – 2014-03-14 (×3): 10 mg via ORAL
  Filled 2014-03-12 (×3): qty 1

## 2014-03-12 MED ORDER — ATROPINE SULFATE 0.1 MG/ML IJ SOLN
INTRAMUSCULAR | Status: DC
Start: 2014-03-12 — End: 2014-03-12
  Filled 2014-03-12: qty 10

## 2014-03-12 MED ORDER — SODIUM CHLORIDE 0.9 % IV SOLN
INTRAVENOUS | Status: DC
  Administered 2014-03-12: 12:00:00 via INTRAVENOUS

## 2014-03-12 MED ORDER — NITROGLYCERIN IN D5W 200-5 MCG/ML-% IV SOLN: 2.0000 ug/min | INTRAVENOUS | Status: DC

## 2014-03-12 MED ORDER — LOSARTAN POTASSIUM 50 MG PO TABS
100.0000 mg | ORAL_TABLET | Freq: Every day | ORAL | Status: DC
  Administered 2014-03-12 – 2014-03-13 (×2): 100 mg via ORAL
  Filled 2014-03-12 (×3): qty 2

## 2014-03-12 MED ORDER — SODIUM CHLORIDE 0.9 % IV SOLN
1.7500 mg/kg/h | INTRAVENOUS | Status: DC
  Administered 2014-03-12: 0.25 mg/kg/h via INTRAVENOUS
  Filled 2014-03-12 (×2): qty 250

## 2014-03-12 MED ORDER — NITROGLYCERIN IN D5W 200-5 MCG/ML-% IV SOLN
10.0000 ug/min | Freq: Once | INTRAVENOUS | Status: AC
  Administered 2014-03-12: 10 ug/min via INTRAVENOUS
  Filled 2014-03-12: qty 250

## 2014-03-12 MED ORDER — INSULIN ASPART 100 UNIT/ML ~~LOC~~ SOLN
0.0000 [IU] | Freq: Three times a day (TID) | SUBCUTANEOUS | Status: DC
  Administered 2014-03-12: 3 [IU] via SUBCUTANEOUS
  Administered 2014-03-12: 5 [IU] via SUBCUTANEOUS
  Administered 2014-03-13: 3 [IU] via SUBCUTANEOUS

## 2014-03-12 MED ORDER — METOPROLOL SUCCINATE ER 25 MG PO TB24
25.0000 mg | ORAL_TABLET | Freq: Every day | ORAL | Status: DC
  Administered 2014-03-12: 25 mg via ORAL
  Filled 2014-03-12 (×2): qty 1

## 2014-03-12 MED ORDER — ASPIRIN EC 81 MG PO TBEC
81.0000 mg | DELAYED_RELEASE_TABLET | Freq: Every day | ORAL | Status: DC
  Administered 2014-03-12 – 2014-03-15 (×4): 81 mg via ORAL
  Filled 2014-03-12 (×4): qty 1

## 2014-03-12 MED ORDER — SODIUM CHLORIDE 0.9 % IV SOLN
1.7500 mg/kg/h | INTRAVENOUS | Status: AC
  Administered 2014-03-12: 1.75 mg/kg/h via INTRAVENOUS
  Filled 2014-03-12: qty 250

## 2014-03-12 MED ORDER — HEPARIN (PORCINE) IN NACL 100-0.45 UNIT/ML-% IJ SOLN
1100.0000 [IU]/h | INTRAMUSCULAR | Status: DC
  Administered 2014-03-12: 1100 [IU]/h via INTRAVENOUS
  Filled 2014-03-12 (×2): qty 250

## 2014-03-12 MED ORDER — NITROGLYCERIN 0.4 MG SL SUBL: 0.4000 mg | SUBLINGUAL_TABLET | SUBLINGUAL | Status: DC | PRN
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
## 2014-03-12 NOTE — Progress Notes (Signed)
Chaplain called to spend some time with wife of pt outside cath lab waiting area.  Chaplain spent time talking with pt's wife and offering emotional and spiritual support.  She seemed grateful for the chaplain presence.

## 2014-03-12 NOTE — CV Procedure (Addendum)
William Calderon is a 53 y.o. male    [id number]  [id number] LOCATION:  FACILITY: [facility]  PHYSICIAN: Troy Sine, MD, [facility] Jan 29, 1961   DATE OF PROCEDURE:  03/12/2014    EMERGENCY CARDIAC CATHETERIZATION/PERCUTANEOUS CORONARY INTERVENTION     HISTORY:    TRAVIOUS Calderon is a 53 y.o. male who underwent CABG revascularization surgery in 2003, and had a LIMA placed to his LAD, SVG sequentially to his old one and one 2, and SVG to his diagonal vessel, and an SVG to the posterolateral branch of his RCA.  Patient also has a history of prior CVA, history of remote PE.  He also status post radiation and chemotherapy for non-Hodgkin's lymphoma.  He was awakened from sleep.  This morning with significant chest pain and was found to have lateral ST segment elevation in leads 1 and L. with inferior ST depression when he presented to [facility] long emergency room.  A code stemming was called and he was transferred acutely to the [facility] catheterization laboratory for acute catheterization and intervention.   PROCEDURE: Emergent cardiac catheterization: Coronary angiography of native vessels, angiography of saphenous vein grafts, selective angiography of the left internal mammary artery graft, left ventriculography, percutaneous coronary intervention with PTCA at multiple sites with DES stenting x2 in the SVG supplying the diagonal vessel during acute MI.  The patient was brought to the cardiac catheterization laboratory in transfer from [facility] long emergency room.  Upon arrival he was still experiencing chest pain.  Versed 2 mg and fentanyl 50 mcg were administered for conscious sedation.  His right femoral artery was punctured anteriorly and a 6 French sheath was inserted.  Diagnostic catheterization was done utilizing 6 French Judkins 4 left and right coronary catheters into the native vessels.  The right catheter was used for selective angiography into the vein grafts.  A LIMA catheter was used for  selective angiography into the left internal mammary artery.  With the demonstration of an acute infarct vessel being the vein graft supplying the diagonal vessel percutaneous coronary intervention was performed initially using a 6 [place] FFR for guide and prowaterwire.  Angiomax bolus plus infusion was administered.  With the patient's history of a prior CVA with questionable etiology, Plavix, 600 mg was administered orally and a platelet therapy rather than the more potent other agents.  ACT was documented to be therapeutic.  A Euphora 2.5x15 mm balloon was advanced into the vein graft proximally at the site of total occlusion.  Once initial dilatation was done, it became apparent that there was diffuse disease beyond the initial occlusion extending into the mid and distal portion of the graft.  Multiple dilatations were made with this balloon up to 13 atmospheres.  A Xiience Alpine 3.5x38 mm DES stent was then inserted in the more distal aspect of the graft and dilated x2 at 13 atmospheres.  Following the initial stent deployment, there was slow flow/no reflow distally.  The patient received several doses of IC nitroglycerin.  In addition to 200 mcg of intracoronary verapamil with resolution.  An additional Xiience Alpine 3.5x18 mm stent was deployed in tandem fashion proximal to the initially placed stent.  There was significant difficulty with catheter back up at this point.  Ultimately, the right guiding catheter was then removed and a RBG catheter was inserted.  The lesion was rewired and post stent dilatation was done with a [place] Euphora 3.75 x 20 mm balloon with post stent dilatation up to 3.76 mm.  Angiography confirmed  an excellent result. Left ventriculography was then performed with a 6 French pigtail catheter.  The arterial sheath was sutured in place.  Bivalirudin will be administered for an additional 4 hours.  The patient left the cardiac catheterization laboratory, chest pain free with stable  hemodynamics.  HEMODYNAMICS:   Central Aorta: 140/82   Left Ventricle: 140/15  ANGIOGRAPHY:  Left main: Normal vessel  LAD: Occluded just beyond the ostium with faint antegrade collateralization proximally  Left circumflex: Occluded proximally just beyond the left main takeoff  Right coronary artery: Diffusely diseased with significant luminal irregularity.  There was a 90% proximal to mid stenosis in the region of an anterior RV branch takeoff.  The distal PLA was occluded.  LIMA to LAD: Widely patent anastomosing to the mid LAD.  The distal LAD seem to collateralize the PLA branch of the RCA  SVG to the OM1 an OM2 was a sequential graft and was widely patent.  SVG to RCA was occluded at its origin which appeared old.  SVG to the diagonal vessel is totally occluded in the proximal segment.  This appeared to be the acute infarct vessel.  There was evidence for thrombus proximally.  Following successful percutaneous coronary intervention to the SVG to the diagonal vessel, following initial opening of the vessel demonstrated diffuse disease in the mid to mid-distal portion of the graft.  All the sites were dilated   Left ventriculography revealed an ejection fraction of approximately 35%.  There was severe hypo-akinesis of the mid anterolateral wall.  There was also focal, basal, inferior hypokinesis. There is trace to 1+ angiographic mitral regurgitation.   IMPRESSION:  ST segment elevation myocardial infarction secondary to acute occlusion of the saphenous vein graft supplying the diagonal vessel  Severe native coronary artery disease with total occlusion of the LAD and circumflex vessel near its origin, and diffusely diseased RCA with 90% focal mid stenosis and total occlusion of the distal PLA with evidence for left to right collaterals to the PLA vessel  Patent LIMA graft to the mid LAD with evidence for collateralization of the PLA via the distal LAD.  Patent sequential  vein graft supplying the OM1 and OM 2 vessels  Probable old occlusion of the SVG to the RCA  Fresh occlusion of the SVG supplying the diagonal vessel  Successful percutaneous coronary intervention to the SVG to the diagonal vessel with PTCA and ultimate tandem stenting with 3.5x38 and 3.5x18 mm Xience Alpine DES stents post dilated to 3.50mm.  RECOMMENDATION:  Mr. Shein is 12 years status post CABG revascularization surgery.  He presented today with ST elevation anterolaterally, concordant with his SVG to diagonal occlusion.  This was successfully intervened upon.  He does have residual high grade mid-RCA stenosis, which most likely will need staged PCI in light of the vein graft to the RCA being occluded.    Troy Sine, MD, [facility] 03/12/2014 6:51 AM
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
## 2014-03-12 NOTE — ED Notes (Addendum)
Initial contact-pt was awoken at 0130 this morning with sharp, shooting CP radiating to right and left shoulder. Denies lightheadedness or dizziness but has felt like he was "sweating" at times. Patient has not vomited with CP episode. 2L O2 [place] placed on patient and EKG completed immediately. Hx CABG, MI (at 53 yo), DM, HTN. No longer on blood thinners. No nitroglycerin taken. Took four baby aspirin before going to bed tonight. Awaiting MD.

## 2014-03-12 NOTE — Progress Notes (Signed)
  Echocardiogram 2D Echocardiogram has been performed.  Carney Corners 03/12/2014, 3:57 PM

## 2014-03-12 NOTE — ED Notes (Signed)
Pt arrived to the ED with a complaint of chest pain.  Pt has a history of MI.  Pt has had 5 bypasses.  Pt has pain located in the center chest radiating to the shoulders and neck area.  Pt is short of breath.  Pt is diaphoretic.  Pt is clutching his chest

## 2014-03-12 NOTE — Progress Notes (Signed)
R femoral sheath pull complete. Pressure initiated to R groin at 1200. Pressure held until 1225. R groin level 1 before, during and once sheath pull complete. R pedal pulses palpable throughput sheath pull. Pt educated on when to hold pressure and when to notify RN. Vital signs stable before during and after sheath pull. Will continue to monitor closely and update as needed.

## 2014-03-13 DIAGNOSIS — I10 Essential (primary) hypertension: Secondary | ICD-10-CM

## 2014-03-13 DIAGNOSIS — F17200 Nicotine dependence, unspecified, uncomplicated: Secondary | ICD-10-CM

## 2014-03-13 DIAGNOSIS — I219 Acute myocardial infarction, unspecified: Secondary | ICD-10-CM

## 2014-03-13 DIAGNOSIS — I2510 Atherosclerotic heart disease of native coronary artery without angina pectoris: Secondary | ICD-10-CM

## 2014-03-13 DIAGNOSIS — I70209 Unspecified atherosclerosis of native arteries of extremities, unspecified extremity: Secondary | ICD-10-CM

## 2014-03-13 DIAGNOSIS — E785 Hyperlipidemia, unspecified: Secondary | ICD-10-CM

## 2014-03-13 DIAGNOSIS — E1159 Type 2 diabetes mellitus with other circulatory complications: Secondary | ICD-10-CM

## 2014-03-13 DIAGNOSIS — Z951 Presence of aortocoronary bypass graft: Secondary | ICD-10-CM

## 2014-03-13 LAB — CBC
HEMATOCRIT: 41.9 % (ref 39.0–52.0)
Hemoglobin: 14.4 g/dL (ref 13.0–17.0)
MCH: 30.2 pg (ref 26.0–34.0)
MCHC: 34.4 g/dL (ref 30.0–36.0)
MCV: 87.8 fL (ref 78.0–100.0)
Platelets: 188 10*3/uL (ref 150–400)
RBC: 4.77 MIL/uL (ref 4.22–5.81)
RDW: 12.7 % (ref 11.5–15.5)
WBC: 12.6 10*3/uL — AB (ref 4.0–10.5)

## 2014-03-13 LAB — GLUCOSE, CAPILLARY
GLUCOSE-CAPILLARY: 210 mg/dL — AB (ref 70–99)
GLUCOSE-CAPILLARY: 231 mg/dL — AB (ref 70–99)
Glucose-Capillary: 240 mg/dL — ABNORMAL HIGH (ref 70–99)
Glucose-Capillary: 205 mg/dL — ABNORMAL HIGH (ref 70–99)

## 2014-03-13 LAB — LIPID PANEL
CHOLESTEROL: 218 mg/dL — AB (ref 0–200)
HDL: 27 mg/dL — AB (ref >39)
LDL Cholesterol: 150 mg/dL — ABNORMAL HIGH (ref 0–99)
Total CHOL/HDL Ratio: 8.1 RATIO
Triglycerides: 204 mg/dL — ABNORMAL HIGH (ref <150)
VLDL: 41 mg/dL — ABNORMAL HIGH (ref 0–40)

## 2014-03-13 LAB — PROTIME-INR
INR: 1.1 (ref 0.00–1.49)
PROTHROMBIN TIME: 14 s (ref 11.6–15.2)

## 2014-03-13 LAB — BASIC METABOLIC PANEL
BUN: 6 mg/dL (ref 6–23)
CHLORIDE: 100 meq/L (ref 96–112)
CO2: 20 meq/L (ref 19–32)
Calcium: 9 mg/dL (ref 8.4–10.5)
Creatinine, Ser: 0.72 mg/dL (ref 0.50–1.35)
GFR calc Af Amer: >90 mL/min (ref >90)
GFR calc non Af Amer: >90 mL/min (ref >90)
Glucose, Bld: 222 mg/dL — ABNORMAL HIGH (ref 70–99)
Potassium: 3.6 mEq/L — ABNORMAL LOW (ref 3.7–5.3)
Sodium: 136 mEq/L — ABNORMAL LOW (ref 137–147)

## 2014-03-13 LAB — POCT ACTIVATED CLOTTING TIME: Activated Clotting Time: 459 seconds

## 2014-03-13 MED ORDER — INSULIN ASPART 100 UNIT/ML ~~LOC~~ SOLN
0.0000 [IU] | Freq: Every day | SUBCUTANEOUS | Status: DC
Start: 2014-03-13 — End: 2014-03-15
  Administered 2014-03-13 – 2014-03-14 (×2): 2 [IU] via SUBCUTANEOUS

## 2014-03-13 MED ORDER — CARVEDILOL 12.5 MG PO TABS
12.5000 mg | ORAL_TABLET | Freq: Two times a day (BID) | ORAL | Status: DC
  Administered 2014-03-13 – 2014-03-15 (×5): 12.5 mg via ORAL
  Filled 2014-03-13 (×7): qty 1

## 2014-03-13 MED ORDER — ATORVASTATIN CALCIUM 80 MG PO TABS
80.0000 mg | ORAL_TABLET | Freq: Every day | ORAL | Status: DC
  Filled 2014-03-13 (×3): qty 1

## 2014-03-13 MED ORDER — PERFLUTREN LIPID MICROSPHERE
INTRAVENOUS | Status: AC
  Administered 2014-03-13: 10:00:00
  Filled 2014-03-13: qty 10

## 2014-03-13 MED ORDER — LIVING WELL WITH DIABETES BOOK
Freq: Once | Status: AC
  Administered 2014-03-13: 18:00:00
  Filled 2014-03-13: qty 1

## 2014-03-13 MED ORDER — INSULIN ASPART 100 UNIT/ML ~~LOC~~ SOLN
0.0000 [IU] | Freq: Three times a day (TID) | SUBCUTANEOUS | Status: DC
  Administered 2014-03-13 – 2014-03-14 (×5): 5 [IU] via SUBCUTANEOUS
  Administered 2014-03-15: 3 [IU] via SUBCUTANEOUS

## 2014-03-13 MED FILL — Bivalirudin Trifluoroacetate For IV Soln 250 MG (Base Equiv): INTRAVENOUS | Qty: 250 | Status: AC

## 2014-03-13 MED FILL — Verapamil HCl IV Soln 2.5 MG/ML: INTRAVENOUS | Qty: 2 | Status: AC

## 2014-03-13 MED FILL — Midazolam HCl Inj 2 MG/2ML (Base Equivalent): INTRAMUSCULAR | Qty: 2 | Status: AC

## 2014-03-13 MED FILL — Lidocaine HCl Local Preservative Free (PF) Inj 1%: INTRAMUSCULAR | Qty: 30 | Status: AC

## 2014-03-13 MED FILL — Fentanyl Citrate Inj 0.05 MG/ML: INTRAMUSCULAR | Qty: 2 | Status: AC

## 2014-03-13 MED FILL — Famotidine in NaCl 0.9% IV Soln 20 MG/50ML: INTRAVENOUS | Qty: 50 | Status: AC

## 2014-03-13 MED FILL — Midazolam HCl Inj 10 MG/2ML (Base Equivalent): INTRAMUSCULAR | Qty: 2 | Status: AC

## 2014-03-13 MED FILL — Heparin Sodium (Porcine) 2 Unit/ML in Sodium Chloride 0.9%: INTRAMUSCULAR | Qty: 1000 | Status: AC

## 2014-03-13 MED FILL — Nitroglycerin IV Soln 200 MCG/ML in D5W: INTRAVENOUS | Qty: 1 | Status: AC

## 2014-03-13 MED FILL — Sodium Chloride IV Soln 0.9%: INTRAVENOUS | Qty: 50 | Status: AC

## 2014-03-13 MED FILL — Clopidogrel Bisulfate Tab 300 MG (Base Equiv): ORAL | Qty: 2 | Status: AC

## 2014-03-13 NOTE — Progress Notes (Signed)
CARDIAC REHAB PHASE I   PRE:  Rate/Rhythm: 3 SR with PVC's  BP:  Supine:   Sitting: 131/74  Standing:    SaO2:   MODE:  Ambulation: 700 ft   POST:  Rate/Rhythm: 87 SR with freq PVC's  BP:  Supine:   Sitting: 158./74  Standing:    SaO2:  1425-1515 Pt tolerated ambulation well without c/o of cp or SOB. PVC's more frequent after walk. Pt does c/o of bilateral hip pain after walking. Pt back to recliner after walk. Started MI and stent education with pt and wife. They voice understanding. Pt expresses that he had many problems trying to take medications. He seems to have a lot of side effects from many of them. We will follow pt in the am to complete education.  Rodney Langton RN 03/13/2014 3:18 PM

## 2014-03-13 NOTE — Progress Notes (Signed)
Inpatient Diabetes Program Recommendations  AACE/ADA: New Consensus Statement on Inpatient Glycemic Control (2013)  Target Ranges:  Prepandial:   less than 140 mg/dL      Peak postprandial:   less than 180 mg/dL (1-2 hours)      Critically ill patients:  140 - 180 mg/dL   Diabetes Coordinator met with patient to discuss new diagnosis of DM with A1C=9.3 and offer OP DM referral.  Patient declined OP referral at this time.  Patient states that he was previously told he was "borderline" and was started on Metformin.  Patient states that about two years ago one of his MD's told him he could stop taking it.  Stressed importance of monitoring glucose at home before meals and taking meds as prescribed.  Also briefly discussed carbohydrate modified diet guidelines and current recommendations for diabetes care ie eye exams, monitor kidney function, A1C, visual inspection of feet and cholesterol monitoring.  No further questions/concerns at the end of our visit.  Living Well with Diabetes booklet has been ordered for patient from pharmacy.  A diabetes meal planning guide was given to the patient. Thank you  Raoul Pitch BSN, RN,CDE Inpatient Diabetes Coordinator [phone] (team pager)

## 2014-03-13 NOTE — Progress Notes (Signed)
Utilization Review Completed.Neoma Laming T Dowell6/05/2014

## 2014-03-13 NOTE — Progress Notes (Signed)
Reviewed cath films with Dr. Ellyn Hack - there are collaterals to the distal RCA from the LAD system. He is not having angina - the SVG to RCA occlusion appears old. He needs major improvement in medical therapy, smoking cessation, etc .. Would not recommend staged PCI to the RCA at this time.  Pixie Casino, MD, [facility] Attending Cardiologist [place]

## 2014-03-13 NOTE — Progress Notes (Signed)
DAILY PROGRESS NOTE  Subjective:  S/p anterior STEMI yesterday - the culprit vessel was the SVG to diagonal which was ultimately intervened upon with a long 3.5 x 38 mm and 3.5 x 18 mm tandem Xience alpine DES.  The vein graft to the RCA was occluded and there is a 90% focal mid-RCA stenosis which is residual.  Troponin is markedly elevated >20.  TC 218, TG 204, HDL 27, LDL 150 (fasting).  HbA1c 9.3.  Echo showed LVEF 35-40%, possible apical thrombus.  Objective:  Temp:  [97.6 F (36.4 C)-98.4 F (36.9 C)] 98.4 F (36.9 C) (06/08 0728) Pulse Rate:  [35-85] 85 (06/08 0700) Resp:  [14-24] 20 (06/08 0700) BP: (88-166)/(49-92) 112/49 mmHg (06/08 0700) SpO2:  [90 %-98 %] 93 % (06/08 0700) Arterial Line BP: (136-147)/(77-86) 136/78 mmHg (06/07 1124) Weight change:   Intake/Output from previous day: 06/07 0701 - 06/08 0700 In: 1580.3 [I.V.:1580.3] Out: 1950 [Urine:1950]  Intake/Output from this shift: Total I/O In: -  Out: 300 [Urine:300]  Medications: Current Facility-Administered Medications  Medication Dose Route Frequency Provider Last Rate Last Dose  . 0.9 %  sodium chloride infusion  20 mL Intravenous Continuous Kalman Drape, MD 10 mL/hr at 03/12/14 0412 20 mL at 03/12/14 0412  . 0.9 %  sodium chloride infusion   Intravenous Continuous Troy Sine, MD      . ALPRAZolam Duanne Moron) tablet 0.5 mg  0.5 mg Oral BID PRN Minus Breeding, MD   0.5 mg at 03/12/14 1718  . amLODipine (NORVASC) tablet 10 mg  10 mg Oral Daily Minus Breeding, MD   10 mg at 03/12/14 0925  . aspirin EC tablet 81 mg  81 mg Oral Daily Troy Sine, MD   81 mg at 03/12/14 0925  . clopidogrel (PLAVIX) tablet 75 mg  75 mg Oral Q breakfast Troy Sine, MD   75 mg at 03/12/14 0925  . insulin aspart (novoLOG) injection 0-9 Units  0-9 Units Subcutaneous TID WC Troy Sine, MD   3 Units at 03/12/14 1718  . losartan (COZAAR) tablet 100 mg  100 mg Oral QHS Minus Breeding, MD   100 mg at 03/12/14 2132  .  metoprolol succinate (TOPROL-XL) 24 hr tablet 25 mg  25 mg Oral Daily Minus Breeding, MD   25 mg at 03/12/14 1093  . nitroGLYCERIN (NITROSTAT) SL tablet 0.4 mg  0.4 mg Sublingual Q5 Min x 3 PRN Minus Breeding, MD      . nitroGLYCERIN 0.2 mg/mL in dextrose 5 % infusion  2-200 mcg/min Intravenous Continuous Troy Sine, MD 6 mL/hr at 03/13/14 0700 20 mcg/min at 03/13/14 0700    Physical Exam: General appearance: alert and no distress Neck: no carotid bruit and no JVD Lungs: clear to auscultation bilaterally Heart: regular rate and rhythm and occasional missed beats Abdomen: soft, non-tender; bowel sounds normal; no masses,  no organomegaly Extremities: extremities normal, atraumatic, no cyanosis or edema and no groin hematoma, bruit, or ecchymosis Pulses: 2+ and symmetric Skin: Skin color, texture, turgor normal. No rashes or lesions Neurologic: Grossly normal Psych: Anxiousr  Lab Results: Results for orders placed during the hospital encounter of 03/12/14 (from the past 48 hour(s))  CBC WITH DIFFERENTIAL     Status: None   Collection Time    03/12/14  3:59 AM      Result Value Ref Range   WBC 9.0  4.0 - 10.5 K/uL   RBC 5.52  4.22 - 5.81 MIL/uL  Hemoglobin 16.8  13.0 - 17.0 g/dL   HCT 48.4  39.0 - 52.0 %   MCV 87.7  78.0 - 100.0 fL   MCH 30.4  26.0 - 34.0 pg   MCHC 34.7  30.0 - 36.0 g/dL   RDW 12.8  11.5 - 15.5 %   Platelets 202  150 - 400 K/uL   Neutrophils Relative % 65  43 - 77 %   Neutro Abs 5.8  1.7 - 7.7 K/uL   Lymphocytes Relative 24  12 - 46 %   Lymphs Abs 2.1  0.7 - 4.0 K/uL   Monocytes Relative 8  3 - 12 %   Monocytes Absolute 0.7  0.1 - 1.0 K/uL   Eosinophils Relative 2  0 - 5 %   Eosinophils Absolute 0.2  0.0 - 0.7 K/uL   Basophils Relative 1  0 - 1 %   Basophils Absolute 0.1  0.0 - 0.1 K/uL  COMPREHENSIVE METABOLIC PANEL     Status: Abnormal   Collection Time    03/12/14  3:59 AM      Result Value Ref Range   Sodium 136 (*) 137 - 147 mEq/L   Potassium 3.4  (*) 3.7 - 5.3 mEq/L   Chloride 96  96 - 112 mEq/L   CO2 23  19 - 32 mEq/L   Glucose, Bld 293 (*) 70 - 99 mg/dL   BUN 11  6 - 23 mg/dL   Creatinine, Ser 0.73  0.50 - 1.35 mg/dL   Calcium 10.0  8.4 - 10.5 mg/dL   Total Protein 7.4  6.0 - 8.3 g/dL   Albumin 4.1  3.5 - 5.2 g/dL   AST 19  0 - 37 U/L   ALT 25  0 - 53 U/L   Alkaline Phosphatase 87  39 - 117 U/L   Total Bilirubin 0.5  0.3 - 1.2 mg/dL   GFR calc non Af Amer >90  >90 mL/min   GFR calc Af Amer >90  >90 mL/min   Comment: (NOTE)     The eGFR has been calculated using the CKD EPI equation.     This calculation has not been validated in all clinical situations.     eGFR's persistently <90 mL/min signify possible Chronic Kidney     Disease.  D-DIMER, QUANTITATIVE     Status: Abnormal   Collection Time    03/12/14  3:59 AM      Result Value Ref Range   D-Dimer, Quant 0.65 (*) 0.00 - 0.48 ug/mL-FEU   Comment:            AT THE INHOUSE ESTABLISHED CUTOFF     VALUE OF 0.48 ug/mL FEU,     THIS ASSAY HAS BEEN DOCUMENTED     IN THE LITERATURE TO HAVE     A SENSITIVITY AND NEGATIVE     PREDICTIVE VALUE OF AT LEAST     98 TO 99%.  THE TEST RESULT     SHOULD BE CORRELATED WITH     AN ASSESSMENT OF THE CLINICAL     PROBABILITY OF DVT / VTE.  APTT     Status: None   Collection Time    03/12/14  3:59 AM      Result Value Ref Range   aPTT 27  24 - 37 seconds  PROTIME-INR     Status: None   Collection Time    03/12/14  3:59 AM      Result Value Ref Range  Prothrombin Time 12.5  11.6 - 15.2 seconds   INR 0.95  0.00 - 1.49  I-STAT TROPOININ, ED     Status: None   Collection Time    03/12/14  4:07 AM      Result Value Ref Range   Troponin i, poc 0.03  0.00 - 0.08 ng/mL   Comment 3            Comment: Due to the release kinetics of cTnI,     a negative result within the first hours     of the onset of symptoms does not rule out     myocardial infarction with certainty.     If myocardial infarction is still suspected,      repeat the test at appropriate intervals.  I-STAT CHEM 8, ED     Status: Abnormal   Collection Time    03/12/14  4:38 AM      Result Value Ref Range   Sodium 138  137 - 147 mEq/L   Potassium 3.7  3.7 - 5.3 mEq/L   Chloride 102  96 - 112 mEq/L   BUN 11  6 - 23 mg/dL   Creatinine, Ser 0.80  0.50 - 1.35 mg/dL   Glucose, Bld 288 (*) 70 - 99 mg/dL   Calcium, Ion 1.20  1.12 - 1.23 mmol/L   TCO2 26  0 - 100 mmol/L   Hemoglobin 17.3 (*) 13.0 - 17.0 g/dL   HCT 51.0  39.0 - 52.0 %  TROPONIN I     Status: Abnormal   Collection Time    03/12/14  8:00 AM      Result Value Ref Range   Troponin I >20.00 (*) <0.30 ng/mL   Comment:            Due to the release kinetics of cTnI,     a negative result within the first hours     of the onset of symptoms does not rule out     myocardial infarction with certainty.     If myocardial infarction is still suspected,     repeat the test at appropriate intervals.     CRITICAL RESULT CALLED TO, READ BACK BY AND VERIFIED WITH:     RICE C,RN 03/12/14 0916 WAYK  TSH     Status: None   Collection Time    03/12/14  8:00 AM      Result Value Ref Range   TSH 0.915  0.350 - 4.500 uIU/mL  MRSA PCR SCREENING     Status: None   Collection Time    03/12/14  8:49 AM      Result Value Ref Range   MRSA by PCR NEGATIVE  NEGATIVE   Comment:            The GeneXpert MRSA Assay (FDA     approved for NASAL specimens     only), is one component of a     comprehensive MRSA colonization     surveillance program. It is not     intended to diagnose MRSA     infection nor to guide or     monitor treatment for     MRSA infections.  GLUCOSE, CAPILLARY     Status: Abnormal   Collection Time    03/12/14 11:24 AM      Result Value Ref Range   Glucose-Capillary 251 (*) 70 - 99 mg/dL  POCT ACTIVATED CLOTTING TIME     Status: None   Collection Time  03/12/14 11:53 AM      Result Value Ref Range   Activated Clotting Time 149    TROPONIN I     Status: Abnormal    Collection Time    03/12/14 12:45 PM      Result Value Ref Range   Troponin I >20.00 (*) <0.30 ng/mL   Comment:            Due to the release kinetics of cTnI,     a negative result within the first hours     of the onset of symptoms does not rule out     myocardial infarction with certainty.     If myocardial infarction is still suspected,     repeat the test at appropriate intervals.     CRITICAL VALUE NOTED.  VALUE IS CONSISTENT WITH PREVIOUSLY REPORTED AND CALLED VALUE.  HEMOGLOBIN A1C     Status: Abnormal   Collection Time    03/12/14 12:45 PM      Result Value Ref Range   Hemoglobin A1C 9.3 (*) <5.7 %   Comment: (NOTE)                                                                               According to the ADA Clinical Practice Recommendations for 2011, when     HbA1c is used as a screening test:      >=6.5%   Diagnostic of Diabetes Mellitus               (if abnormal result is confirmed)     5.7-6.4%   Increased risk of developing Diabetes Mellitus     References:Diagnosis and Classification of Diabetes Mellitus,Diabetes     SWFU,9323,55(DDUKG 1):S62-S69 and Standards of Medical Care in             Diabetes - 2011,Diabetes Care,2011,34 (Suppl 1):S11-S61.   Mean Plasma Glucose 220 (*) <117 mg/dL   Comment: Performed at [facility], CAPILLARY     Status: Abnormal   Collection Time    03/12/14  5:10 PM      Result Value Ref Range   Glucose-Capillary 250 (*) 70 - 99 mg/dL  TROPONIN I     Status: Abnormal   Collection Time    03/12/14  6:30 PM      Result Value Ref Range   Troponin I >20.00 (*) <0.30 ng/mL   Comment:            Due to the release kinetics of cTnI,     a negative result within the first hours     of the onset of symptoms does not rule out     myocardial infarction with certainty.     If myocardial infarction is still suspected,     repeat the test at appropriate intervals.     CRITICAL VALUE NOTED.  VALUE IS CONSISTENT WITH  PREVIOUSLY REPORTED AND CALLED VALUE.  GLUCOSE, CAPILLARY     Status: Abnormal   Collection Time    03/12/14  9:31 PM      Result Value Ref Range   Glucose-Capillary 230 (*) 70 - 99 mg/dL  CBC     Status: Abnormal  Collection Time    03/13/14  3:07 AM      Result Value Ref Range   WBC 12.6 (*) 4.0 - 10.5 K/uL   RBC 4.77  4.22 - 5.81 MIL/uL   Hemoglobin 14.4  13.0 - 17.0 g/dL   HCT 41.9  39.0 - 52.0 %   MCV 87.8  78.0 - 100.0 fL   MCH 30.2  26.0 - 34.0 pg   MCHC 34.4  30.0 - 36.0 g/dL   RDW 12.7  11.5 - 15.5 %   Platelets 188  150 - 400 K/uL  BASIC METABOLIC PANEL     Status: Abnormal   Collection Time    03/13/14  3:07 AM      Result Value Ref Range   Sodium 136 (*) 137 - 147 mEq/L   Potassium 3.6 (*) 3.7 - 5.3 mEq/L   Chloride 100  96 - 112 mEq/L   CO2 20  19 - 32 mEq/L   Glucose, Bld 222 (*) 70 - 99 mg/dL   BUN 6  6 - 23 mg/dL   Creatinine, Ser 0.72  0.50 - 1.35 mg/dL   Calcium 9.0  8.4 - 10.5 mg/dL   GFR calc non Af Amer >90  >90 mL/min   GFR calc Af Amer >90  >90 mL/min   Comment: (NOTE)     The eGFR has been calculated using the CKD EPI equation.     This calculation has not been validated in all clinical situations.     eGFR's persistently <90 mL/min signify possible Chronic Kidney     Disease.  PROTIME-INR     Status: None   Collection Time    03/13/14  3:07 AM      Result Value Ref Range   Prothrombin Time 14.0  11.6 - 15.2 seconds   INR 1.10  0.00 - 1.49  LIPID PANEL     Status: Abnormal   Collection Time    03/13/14  3:07 AM      Result Value Ref Range   Cholesterol 218 (*) 0 - 200 mg/dL   Triglycerides 204 (*) <150 mg/dL   HDL 27 (*) >39 mg/dL   Total CHOL/HDL Ratio 8.1     VLDL 41 (*) 0 - 40 mg/dL   LDL Cholesterol 150 (*) 0 - 99 mg/dL   Comment:            Total Cholesterol/HDL:CHD Risk     Coronary Heart Disease Risk Table                         Men   Women      1/2 Average Risk   3.4   3.3      Average Risk       5.0   4.4      2 X Average  Risk   9.6   7.1      3 X Average Risk  23.4   11.0                Use the calculated Patient Ratio     above and the CHD Risk Table     to determine the patient's CHD Risk.                ATP III CLASSIFICATION (LDL):      <100     mg/dL   Optimal      100-129  mg/dL   Near or Above  Optimal      130-159  mg/dL   Borderline      160-189  mg/dL   High      >190     mg/dL   Very High  GLUCOSE, CAPILLARY     Status: Abnormal   Collection Time    03/13/14  7:29 AM      Result Value Ref Range   Glucose-Capillary 210 (*) 70 - 99 mg/dL    Imaging: Dg Chest Portable 1 View  03/12/2014   CLINICAL DATA:  Chest pain.  EXAM: PORTABLE CHEST - 1 VIEW  COMPARISON:  02/13/2012.  FINDINGS: The heart is mildly enlarged but stable. Stable surgical changes from bypass surgery. Mild vascular congestion but no overt pulmonary edema or pleural effusion.  IMPRESSION: Mild stable cardiac enlargement and mild vascular congestion without overt pulmonary edema or pleural effusion.   Electronically Signed   By: Kalman Jewels M.D.   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 Feels better today. Continues to have some ectopy and had NSVT on admission. He does not want [place] to increase his b-blocker due to "headaches" that it caused him in the past. Will switch from Toprol to Coreg today.  Check limited echo to r/o Apical thrombus.  Will d/w Dr. Claiborne Billings about timing of RCA intervention - I agree that revascularization is indicated to try to improve LVEF.  Will need diabetes educator consult today and likely increase in insulin. Better glucose control. Ok to ambulate with cardiac rehab.  Transfer to stepdown status - but can keep on 2900.  Time Spent Directly with Patient:  15 minutes  Length of Stay:  LOS: 1 day   Pixie Casino, MD, [facility] Attending Cardiologist [place] 03/13/2014, 8:35 AM

## 2014-03-13 NOTE — Progress Notes (Signed)
*  PRELIMINARY RESULTS* Echocardiogram 2D Echocardiogram Limited with Definity has been performed.  William Calderon 03/13/2014, 9:49 AM

## 2014-03-14 DIAGNOSIS — I472 Ventricular tachycardia, unspecified: Secondary | ICD-10-CM

## 2014-03-14 DIAGNOSIS — I255 Ischemic cardiomyopathy: Secondary | ICD-10-CM | POA: Diagnosis not present

## 2014-03-14 DIAGNOSIS — I4729 Other ventricular tachycardia: Secondary | ICD-10-CM

## 2014-03-14 DIAGNOSIS — I2589 Other forms of chronic ischemic heart disease: Secondary | ICD-10-CM

## 2014-03-14 LAB — GLUCOSE, CAPILLARY
Glucose-Capillary: 217 mg/dL — ABNORMAL HIGH (ref 70–99)
Glucose-Capillary: 220 mg/dL — ABNORMAL HIGH (ref 70–99)
Glucose-Capillary: 230 mg/dL — ABNORMAL HIGH (ref 70–99)
Glucose-Capillary: 203 mg/dL — ABNORMAL HIGH (ref 70–99)

## 2014-03-14 MED ORDER — AMLODIPINE BESYLATE 5 MG PO TABS
5.0000 mg | ORAL_TABLET | Freq: Every day | ORAL | Status: DC
  Administered 2014-03-15: 5 mg via ORAL
  Filled 2014-03-14: qty 1

## 2014-03-14 MED ORDER — METFORMIN HCL ER 500 MG PO TB24
500.0000 mg | ORAL_TABLET | Freq: Two times a day (BID) | ORAL | Status: DC
  Administered 2014-03-14 – 2014-03-15 (×2): 500 mg via ORAL
  Filled 2014-03-14 (×4): qty 1

## 2014-03-14 MED ORDER — GLIPIZIDE ER 5 MG PO TB24
5.0000 mg | ORAL_TABLET | Freq: Every day | ORAL | Status: DC
  Administered 2014-03-15: 5 mg via ORAL
  Filled 2014-03-14 (×2): qty 1

## 2014-03-14 MED ORDER — FUROSEMIDE 20 MG PO TABS
20.0000 mg | ORAL_TABLET | Freq: Every day | ORAL | Status: DC
  Administered 2014-03-15: 20 mg via ORAL
  Filled 2014-03-14: qty 1

## 2014-03-14 MED ORDER — VALSARTAN 160 MG PO TABS
320.0000 mg | ORAL_TABLET | Freq: Every day | ORAL | Status: DC
  Administered 2014-03-14: 320 mg via ORAL
  Filled 2014-03-14 (×2): qty 2

## 2014-03-14 NOTE — Progress Notes (Signed)
CARDIAC REHAB PHASE I   PRE:  Rate/Rhythm:   BP:  Supine:   Sitting: 126/80  Standing:    SaO2: 95 RA  MODE:  Ambulation: 550 ft   POST:  Rate/Rhythm: 75 SR  BP:  Supine:   Sitting: 124/60  Standing:    SaO2: 97 RA 1440--1520 Pt tolerated ambulation well without c/o of cp or SOB. He does c/o of pain in both hips walking, tightness. We discussed CHF, zones,daily weights, low sodium diet, fluid restrictions and more about about counting carbs. He is feeling overwhelmed, but realizes that he has got to get really serious about watching his diet. We will follow in the am.  Rodney Langton RN 03/14/2014 3:20 PM   68 SR

## 2014-03-14 NOTE — Progress Notes (Addendum)
Had runs of short run v-tach, asymptomatic, MD aware claimed that pt. had this before. Plan to have life vest. Continue to monitor.Attempted to call [phone] for video x5 but no one is answering the phone. To try later again.

## 2014-03-14 NOTE — Progress Notes (Signed)
DAILY PROGRESS NOTE  Subjective:  No further chest pain.  Limited echo yesterday with contrast does not show LV Apical thrombus. LV function is around 30%. Appreciate diabetes educator recommendations.   Objective:  Temp:  [98.1 F (36.7 C)-99.3 F (37.4 C)] 98.2 F (36.8 C) (06/09 0735) Pulse Rate:  [68-80] 68 (06/09 0735) Resp:  [20-23] 20 (06/08 1623) BP: (113-138)/(53-78) 115/57 mmHg (06/09 0735) SpO2:  [93 %-99 %] 93 % (06/09 0735) Weight change:   Intake/Output from previous day: 06/08 0701 - 06/09 0700 In: 849 [P.O.:840; I.V.:9] Out: 1000 [Urine:1000]  Intake/Output from this shift: Total I/O In: 200 [P.O.:200] Out: -   Medications: Current Facility-Administered Medications  Medication Dose Route Frequency Provider Last Rate Last Dose  . 0.9 %  sodium chloride infusion  20 mL Intravenous Continuous Kalman Drape, MD 10 mL/hr at 03/12/14 0412 20 mL at 03/12/14 0412  . 0.9 %  sodium chloride infusion   Intravenous Continuous Troy Sine, MD      . ALPRAZolam Duanne Moron) tablet 0.5 mg  0.5 mg Oral BID PRN Minus Breeding, MD   0.5 mg at 03/12/14 1718  . amLODipine (NORVASC) tablet 10 mg  10 mg Oral Daily Minus Breeding, MD   10 mg at 03/14/14 0852  . aspirin EC tablet 81 mg  81 mg Oral Daily Troy Sine, MD   81 mg at 03/14/14 9675  . atorvastatin (LIPITOR) tablet 80 mg  80 mg Oral q1800 Pixie Casino, MD      . carvedilol (COREG) tablet 12.5 mg  12.5 mg Oral BID WC Pixie Casino, MD   12.5 mg at 03/14/14 0848  . clopidogrel (PLAVIX) tablet 75 mg  75 mg Oral Q breakfast Troy Sine, MD   75 mg at 03/14/14 0847  . insulin aspart (novoLOG) injection 0-15 Units  0-15 Units Subcutaneous TID WC Pixie Casino, MD   5 Units at 03/14/14 0848  . insulin aspart (novoLOG) injection 0-5 Units  0-5 Units Subcutaneous QHS Pixie Casino, MD   2 Units at 03/13/14 2143  . losartan (COZAAR) tablet 100 mg  100 mg Oral QHS Minus Breeding, MD   100 mg at 03/13/14 2140  .  nitroGLYCERIN (NITROSTAT) SL tablet 0.4 mg  0.4 mg Sublingual Q5 Min x 3 PRN Minus Breeding, MD      . nitroGLYCERIN 0.2 mg/mL in dextrose 5 % infusion  2-200 mcg/min Intravenous Continuous Troy Sine, MD   10 mcg/min at 03/13/14 0800    Physical Exam: General appearance: alert and no distress Neck: no carotid bruit and no JVD Lungs: clear to auscultation bilaterally Heart: regular rate and rhythm and occasional missed beats Abdomen: soft, non-tender; bowel sounds normal; no masses,  no organomegaly Extremities: extremities normal, atraumatic, no cyanosis or edema and no groin hematoma, bruit, or ecchymosis Pulses: 2+ and symmetric Skin: Skin color, texture, turgor normal. No rashes or lesions Neurologic: Grossly normal Psych: Anxiousr  Lab Results: Results for orders placed during the hospital encounter of 03/12/14 (from the past 48 hour(s))  GLUCOSE, CAPILLARY     Status: Abnormal   Collection Time    03/12/14 11:24 AM      Result Value Ref Range   Glucose-Capillary 251 (*) 70 - 99 mg/dL  POCT ACTIVATED CLOTTING TIME     Status: None   Collection Time    03/12/14 11:53 AM      Result Value Ref Range   Activated Clotting Time  149    TROPONIN I     Status: Abnormal   Collection Time    03/12/14 12:45 PM      Result Value Ref Range   Troponin I >20.00 (*) <0.30 ng/mL   Comment:            Due to the release kinetics of cTnI,     a negative result within the first hours     of the onset of symptoms does not rule out     myocardial infarction with certainty.     If myocardial infarction is still suspected,     repeat the test at appropriate intervals.     CRITICAL VALUE NOTED.  VALUE IS CONSISTENT WITH PREVIOUSLY REPORTED AND CALLED VALUE.  HEMOGLOBIN A1C     Status: Abnormal   Collection Time    03/12/14 12:45 PM      Result Value Ref Range   Hemoglobin A1C 9.3 (*) <5.7 %   Comment: (NOTE)                                                                                According to the ADA Clinical Practice Recommendations for 2011, when     HbA1c is used as a screening test:      >=6.5%   Diagnostic of Diabetes Mellitus               (if abnormal result is confirmed)     5.7-6.4%   Increased risk of developing Diabetes Mellitus     References:Diagnosis and Classification of Diabetes Mellitus,Diabetes     ZTIW,5809,98(PJASN 1):S62-S69 and Standards of Medical Care in             Diabetes - 2011,Diabetes Care,2011,34 (Suppl 1):S11-S61.   Mean Plasma Glucose 220 (*) <117 mg/dL   Comment: Performed at [facility], CAPILLARY     Status: Abnormal   Collection Time    03/12/14  5:10 PM      Result Value Ref Range   Glucose-Capillary 250 (*) 70 - 99 mg/dL  TROPONIN I     Status: Abnormal   Collection Time    03/12/14  6:30 PM      Result Value Ref Range   Troponin I >20.00 (*) <0.30 ng/mL   Comment:            Due to the release kinetics of cTnI,     a negative result within the first hours     of the onset of symptoms does not rule out     myocardial infarction with certainty.     If myocardial infarction is still suspected,     repeat the test at appropriate intervals.     CRITICAL VALUE NOTED.  VALUE IS CONSISTENT WITH PREVIOUSLY REPORTED AND CALLED VALUE.  GLUCOSE, CAPILLARY     Status: Abnormal   Collection Time    03/12/14  9:31 PM      Result Value Ref Range   Glucose-Capillary 230 (*) 70 - 99 mg/dL  CBC     Status: Abnormal   Collection Time    03/13/14  3:07 AM      Result Value Ref  Range   WBC 12.6 (*) 4.0 - 10.5 K/uL   RBC 4.77  4.22 - 5.81 MIL/uL   Hemoglobin 14.4  13.0 - 17.0 g/dL   HCT 41.9  39.0 - 52.0 %   MCV 87.8  78.0 - 100.0 fL   MCH 30.2  26.0 - 34.0 pg   MCHC 34.4  30.0 - 36.0 g/dL   RDW 12.7  11.5 - 15.5 %   Platelets 188  150 - 400 K/uL  BASIC METABOLIC PANEL     Status: Abnormal   Collection Time    03/13/14  3:07 AM      Result Value Ref Range   Sodium 136 (*) 137 - 147 mEq/L   Potassium 3.6 (*)  3.7 - 5.3 mEq/L   Chloride 100  96 - 112 mEq/L   CO2 20  19 - 32 mEq/L   Glucose, Bld 222 (*) 70 - 99 mg/dL   BUN 6  6 - 23 mg/dL   Creatinine, Ser 0.72  0.50 - 1.35 mg/dL   Calcium 9.0  8.4 - 10.5 mg/dL   GFR calc non Af Amer >90  >90 mL/min   GFR calc Af Amer >90  >90 mL/min   Comment: (NOTE)     The eGFR has been calculated using the CKD EPI equation.     This calculation has not been validated in all clinical situations.     eGFR's persistently <90 mL/min signify possible Chronic Kidney     Disease.  PROTIME-INR     Status: None   Collection Time    03/13/14  3:07 AM      Result Value Ref Range   Prothrombin Time 14.0  11.6 - 15.2 seconds   INR 1.10  0.00 - 1.49  LIPID PANEL     Status: Abnormal   Collection Time    03/13/14  3:07 AM      Result Value Ref Range   Cholesterol 218 (*) 0 - 200 mg/dL   Triglycerides 204 (*) <150 mg/dL   HDL 27 (*) >39 mg/dL   Total CHOL/HDL Ratio 8.1     VLDL 41 (*) 0 - 40 mg/dL   LDL Cholesterol 150 (*) 0 - 99 mg/dL   Comment:            Total Cholesterol/HDL:CHD Risk     Coronary Heart Disease Risk Table                         Men   Women      1/2 Average Risk   3.4   3.3      Average Risk       5.0   4.4      2 X Average Risk   9.6   7.1      3 X Average Risk  23.4   11.0                Use the calculated Patient Ratio     above and the CHD Risk Table     to determine the patient's CHD Risk.                ATP III CLASSIFICATION (LDL):      <100     mg/dL   Optimal      100-129  mg/dL   Near or Above  Optimal      130-159  mg/dL   Borderline      160-189  mg/dL   High      >190     mg/dL   Very High  GLUCOSE, CAPILLARY     Status: Abnormal   Collection Time    03/13/14  7:29 AM      Result Value Ref Range   Glucose-Capillary 210 (*) 70 - 99 mg/dL  GLUCOSE, CAPILLARY     Status: Abnormal   Collection Time    03/13/14 11:06 AM      Result Value Ref Range   Glucose-Capillary 240 (*) 70 - 99 mg/dL    GLUCOSE, CAPILLARY     Status: Abnormal   Collection Time    03/13/14  4:51 PM      Result Value Ref Range   Glucose-Capillary 205 (*) 70 - 99 mg/dL  GLUCOSE, CAPILLARY     Status: Abnormal   Collection Time    03/13/14  9:31 PM      Result Value Ref Range   Glucose-Capillary 231 (*) 70 - 99 mg/dL  GLUCOSE, CAPILLARY     Status: Abnormal   Collection Time    03/14/14  8:11 AM      Result Value Ref Range   Glucose-Capillary 217 (*) 70 - 99 mg/dL    Imaging: No results found.  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 Continues to have some ectopy had NSVT overnight.  No evidence for LV thrombus, however, EF is ~30%. Seems to be tolerating carvedilol. Will decrease amlodipine and change losartan to diovan for heart failure. Start low dose lasix 20 mg daily. Discussed need for improved diabetes control - will start oral regimen of Metformin and glipizide. He is at high risk for ventricular arrythmia - discussed options including LifeVest.  He is amenable to this and qualifies due to recent MI, EF <35% and NSVT.  Will try to get him fitted later today. Ambulate with cardiac rehab. Should be okay to transfer to telemetry today. Expected discharge tomorrow if LifeVest is fitted.   Time Spent Directly with Patient:  30 minutes  Length of Stay:  LOS: 2 days   Pixie Casino, MD, [facility] Attending Cardiologist [place] 03/14/2014, 10:29 AM

## 2014-03-14 NOTE — Progress Notes (Signed)
Transferred to [facility] room 21 by wheelchair, stable, belongings with pt. Report given to Rn.

## 2014-03-14 NOTE — Progress Notes (Signed)
Inpatient Diabetes Program Recommendations  AACE/ADA: New Consensus Statement on Inpatient Glycemic Control (2013)  Target Ranges:  Prepandial:   less than 140 mg/dL      Peak postprandial:   less than 180 mg/dL (1-2 hours)      Critically ill patients:  140 - 180 mg/dL  Results for CAYNE, YOM (MRN [id number]) as of 03/14/2014 10:33  Ref. Range 03/13/2014 07:29 03/13/2014 11:06 03/13/2014 16:51 03/13/2014 21:31 03/14/2014 08:11  Glucose-Capillary Latest Range: 70-99 mg/dL 210 (H) 240 (H) 205 (H) 231 (H) 217 (H)   Inpatient Diabetes Program Recommendations Insulin - Basal: consider adding low dose basal Lantus or Levemir 10 units  Thank you  Raoul Pitch BSN, RN,CDE Inpatient Diabetes Coordinator [phone] (team pager)

## 2014-03-15 LAB — GLUCOSE, CAPILLARY
Glucose-Capillary: 178 mg/dL — ABNORMAL HIGH (ref 70–99)
Glucose-Capillary: 204 mg/dL — ABNORMAL HIGH (ref 70–99)

## 2014-03-15 MED ORDER — ISOSORBIDE MONONITRATE ER 30 MG PO TB24: 30.0000 mg | ORAL_TABLET | Freq: Every day | ORAL | Status: DC

## 2014-03-15 MED ORDER — VALSARTAN 320 MG PO TABS: 320.0000 mg | ORAL_TABLET | Freq: Every day | ORAL | Status: DC

## 2014-03-15 MED ORDER — GLIPIZIDE ER 5 MG PO TB24: 5.0000 mg | ORAL_TABLET | Freq: Every day | ORAL | Status: DC

## 2014-03-15 MED ORDER — LEVALBUTEROL HCL 0.63 MG/3ML IN NEBU: 0.6300 mg | INHALATION_SOLUTION | Freq: Three times a day (TID) | RESPIRATORY_TRACT | Status: DC

## 2014-03-15 MED ORDER — ASPIRIN 81 MG PO TBEC: 81.0000 mg | DELAYED_RELEASE_TABLET | Freq: Every day | ORAL | Status: AC

## 2014-03-15 MED ORDER — ISOSORBIDE MONONITRATE ER 30 MG PO TB24
30.0000 mg | ORAL_TABLET | Freq: Every day | ORAL | Status: DC
  Filled 2014-03-15: qty 1

## 2014-03-15 MED ORDER — NITROGLYCERIN 0.4 MG SL SUBL: 0.4000 mg | SUBLINGUAL_TABLET | SUBLINGUAL | Status: AC | PRN

## 2014-03-15 MED ORDER — CLOPIDOGREL BISULFATE 75 MG PO TABS: 75.0000 mg | ORAL_TABLET | Freq: Every day | ORAL | Status: DC

## 2014-03-15 MED ORDER — CARVEDILOL 12.5 MG PO TABS: 12.5000 mg | ORAL_TABLET | Freq: Two times a day (BID) | ORAL | Status: DC

## 2014-03-15 MED ORDER — FUROSEMIDE 20 MG PO TABS: 20.0000 mg | ORAL_TABLET | Freq: Every day | ORAL | Status: DC

## 2014-03-15 MED ORDER — IPRATROPIUM BROMIDE 0.02 % IN SOLN: 0.5000 mg | Freq: Three times a day (TID) | RESPIRATORY_TRACT | Status: DC

## 2014-03-15 MED ORDER — METFORMIN HCL ER 500 MG PO TB24: 500.0000 mg | ORAL_TABLET | Freq: Two times a day (BID) | ORAL | Status: DC

## 2014-03-15 MED ORDER — ATORVASTATIN CALCIUM 80 MG PO TABS: 80.0000 mg | ORAL_TABLET | Freq: Every day | ORAL | Status: DC

## 2014-03-15 NOTE — Progress Notes (Signed)
Pt discharge from hospital with life vest on. Discharge instructions reviewed, pt vu.

## 2014-03-15 NOTE — Progress Notes (Signed)
[id number] Cardiac Rehab Completed discharge education with pt. He voices understanding.  Deon Pilling, RN 03/15/2014 10:42 AM

## 2014-03-15 NOTE — Progress Notes (Deleted)
Respiratory therapy note-Called to room by RN, patient in respiratory distress, HR 103, RR-30, poor air movement. PRN albuterol administered. Rapid response called and at bedside for evaluation. Atrovent added to nebulizer, BBS are equal and very diminished, with a mild end expiratory wheeze. Patient is currently on 5l/min [place] sp02 100%, chest xray also performed at this time.

## 2014-03-15 NOTE — Discharge Summary (Signed)
Physician Discharge Summary     Patient ID: William Calderon MRN: [id number] DOB/AGE: 54-Feb-1962 54 y.o.  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 It was severe enough to wake him from his sleep. He described a discomfort in both shoulders. He had diaphoresis but no N/V or SOB. This was similar to previous chest pain. In the ED at [facility] he was found to have lateral ST elevation with inferior ST depression. Treated with IV NTG with heparin. He is currently pain free. He says that he has otherwise been doing well.   He has a history of CABG. He has had a history of a cardiomyopathy. However, his most recent EF on echo 2014 was 55%.   The patient was taken directly to the cath lab for coronary angiography which revealed an occluded SVG to the OM.  He underwent successful percutaneous coronary intervention to the SVG to the diagonal vessel with PTCA and ultimate tandem stenting with 3.5x38 and 3.5x18 mm Xience Alpine DES stents post dilated to 3.74mm.  He does have residual high grade mid-RCA stenosis with an occluded SVG.  There are some collaterals.  Will need to to consider PCI at a later date should he develop symptoms.  2 D echo revealed an EF on 25-30% with no LV thrombus.  Ectopy and NSVT on telemetry.  A lifevest was arranged.  Toprol was changed to Coreg.  A diabetes coordinator consult was completed.  A1C 9.3. He was started on Glipizide and metformin.  The patient was seen by Dr. Claiborne Billings  who felt he was stable for DC home.   Out Patient needs:   Echo in three months.  Recheck lipids in three months.   Does he have any further angina since DC?     Staged PCI to RCA? He does have collateral flow.  Greater than 30 minutes was spent completing the patient's discharge.   Consults:  Diabetes Coordinator, Cardiac Rehab  Significant Diagnostic Studies:  Lipid Panel     Component Value Date/Time   CHOL 218* 03/13/2014 0307   TRIG 204* 03/13/2014 0307   HDL 27* 03/13/2014 0307   CHOLHDL 8.1 03/13/2014 0307   VLDL 41* 03/13/2014 0307   LDLCALC 150* 03/13/2014 0307   LHC HEMODYNAMICS:  Central Aorta: 140/82  Left Ventricle: 140/15  ANGIOGRAPHY:  Left main: Normal vessel  LAD: Occluded just beyond the ostium with faint antegrade collateralization proximally  Left circumflex: Occluded proximally just beyond the left main takeoff  Right coronary artery: Diffusely diseased with significant luminal irregularity. There was a 90% proximal to mid stenosis in the region of an anterior RV branch takeoff. The distal PLA was occluded.  LIMA to LAD: Widely patent anastomosing to the mid LAD. The distal LAD seem to collateralize the PLA branch of the RCA  SVG to the OM1 an OM2 was a sequential graft and was widely patent.  SVG to RCA was occluded at its origin which appeared old.  SVG to the diagonal vessel  is totally occluded in the proximal segment. This appeared to be the acute infarct vessel. There was evidence for thrombus proximally.  Following successful percutaneous coronary intervention to the SVG to the diagonal vessel, following initial opening of the vessel demonstrated diffuse disease in the mid to mid-distal portion of the graft. All the sites were dilated  Left ventriculography revealed an ejection fraction of approximately 35%. There was severe hypo-akinesis of the mid anterolateral wall. There was also focal, basal, inferior hypokinesis. There is trace to 1+ angiographic mitral  regurgitation.  IMPRESSION:  ST segment elevation myocardial infarction secondary to acute occlusion of the saphenous vein graft supplying the diagonal vessel  Severe native coronary artery disease with total occlusion of the LAD and circumflex vessel near its origin, and diffusely diseased RCA with 90% focal mid stenosis and total occlusion of the distal PLA with evidence for left to right collaterals to the PLA vessel  Patent LIMA graft to the mid LAD with evidence for collateralization of the PLA via the distal LAD.  Patent sequential vein graft supplying the OM1 and OM 2 vessels  Probable old occlusion of the SVG to the RCA  Fresh occlusion of the SVG supplying the diagonal vessel  Successful percutaneous coronary intervention to the SVG to the diagonal vessel with PTCA and ultimate tandem stenting with 3.5x38 and 3.5x18 mm Xience Alpine DES stents post dilated to 3.42mm.  RECOMMENDATION:  Mr. Gotay is 12 years status post CABG revascularization surgery. He presented today with ST elevation anterolaterally, concordant with his SVG to diagonal occlusion. This was successfully intervened upon. He does have residual high grade mid-RCA stenosis, which most likely will need staged PCI in light of the vein graft to the RCA being occluded.  Troy Sine, MD, [facility]  03/12/2014  6:51 AM    Echo Study Conclusions  - Left ventricle: apical 4 cahmber and 2 chamber views suspicious for apical thrombus. Recommend definity contrast injection to better elucidate. The cavity size was moderately dilated. There was mild concentric hypertrophy. Systolic function was moderately reduced. The estimated ejection fraction was in the range of 35% to 40%. There is akinesis of the mid-apicalanterior myocardium. There is akinesis of the apical myocardium. There was an increased relative contribution of atrial contraction to ventricular filling. Doppler parameters are consistent with abnormal left ventricular  relaxation (grade 1 diastolic dysfunction). - Mitral valve: Mild, late systolicprolapse, involving the anterior leaflet. There was trivial regurgitation.   Limited Echo with contrast Study Conclusions - Right ventricle: Poorly visualized.  Impressions: - Limited study with Definity. EF 25-30% with diffuse hypokinesis.    Treatments:  See above  Discharge Exam: Blood pressure 118/68, pulse 68, temperature 98.2 F (36.8 C), temperature source Oral, resp. rate 18, height 5\' 7"  (1.702 m), weight 248 lb 7.3 oz (112.7 kg), SpO2 95.00%.   Disposition: 01-Home or Self Care      Discharge Instructions   Amb Referral to Cardiac Rehabilitation    Complete by:  As directed      Diet - low sodium heart healthy    Complete by:  As directed      Discharge instructions    Complete by:  As directed   Weigh yourself every morning.  If you gain 2 pounds in 24 hours or 5 pounds in a week, call the office for instructions on taking more lasix.     Increase activity slowly    Complete by:  As directed  Medication List    STOP taking these medications       aspirin 325 MG tablet  Replaced by:  aspirin 81 MG EC tablet     losartan 100 MG tablet  Commonly known as:  COZAAR     metoprolol succinate 25 MG 24 hr tablet  Commonly known as:  TOPROL-XL      TAKE these medications       ALPRAZolam 0.5 MG tablet  Commonly known as:  XANAX  Take 0.25-0.5 mg by mouth 2 (two) times daily as needed for anxiety.     amLODipine 10 MG tablet  Commonly known as:  NORVASC  Take 10 mg by mouth daily.     aspirin 81 MG EC tablet  Take 1 tablet (81 mg total) by mouth daily.     atorvastatin 80 MG tablet  Commonly known as:  LIPITOR  Take 1 tablet (80 mg total) by mouth daily at 6 PM.     carvedilol 12.5 MG tablet  Commonly known as:  COREG  Take 1 tablet (12.5 mg total) by mouth 2 (two) times daily with a meal.     clopidogrel 75 MG tablet  Commonly known as:  PLAVIX  Take  1 tablet (75 mg total) by mouth daily with breakfast.     furosemide 20 MG tablet  Commonly known as:  LASIX  Take 1 tablet (20 mg total) by mouth daily.     glipiZIDE 5 MG 24 hr tablet  Commonly known as:  GLUCOTROL XL  Take 1 tablet (5 mg total) by mouth daily with breakfast.     isosorbide mononitrate 30 MG 24 hr tablet  Commonly known as:  IMDUR  Take 1 tablet (30 mg total) by mouth daily.     metFORMIN 500 MG 24 hr tablet  Commonly known as:  GLUCOPHAGE-XR  Take 1 tablet (500 mg total) by mouth 2 (two) times daily with a meal.     nitroGLYCERIN 0.4 MG SL tablet  Commonly known as:  NITROSTAT  Place 1 tablet (0.4 mg total) under the tongue every 5 (five) minutes x 3 doses as needed for chest pain.     OMEGA-3 KRILL OIL PO  Take 1 capsule by mouth daily.     valsartan 320 MG tablet  Commonly known as:  DIOVAN  Take 1 tablet (320 mg total) by mouth at bedtime.     VITAMIN D PO  Take 1 tablet by mouth daily.       Follow-up Information   Follow up with Lyda Jester, PA-C On 03/31/2014. (2:00 PM)    Specialty:  Cardiology   Contact information:   [address] [place] [address] [phone]       Signed: Tarri Fuller, [facility] 03/15/2014, 1:09 PM

## 2014-03-15 NOTE — Progress Notes (Signed)
Subjective:  No recurrent chest pain since acute MI intervention to SVG-Dx  Objective:   Vital Signs in the last 24 hours: Temp:  [97.8 F (36.6 C)-98.5 F (36.9 C)] 98.2 F (36.8 C) (06/10 0527) Pulse Rate:  [63-78] 68 (06/10 0859) Resp:  [18] 18 (06/10 0527) BP: (118-128)/(55-78) 118/68 mmHg (06/10 0859) SpO2:  [94 %-96 %] 95 % (06/10 0527)  Intake/Output from previous day: 06/09 0701 - 06/10 0700 In: 1190 [P.O.:1190] Out: -   Medications: . amLODipine  5 mg Oral Daily  . aspirin EC  81 mg Oral Daily  . atorvastatin  80 mg Oral q1800  . carvedilol  12.5 mg Oral BID WC  . clopidogrel  75 mg Oral Q breakfast  . furosemide  20 mg Oral Daily  . glipiZIDE  5 mg Oral Q breakfast  . insulin aspart  0-15 Units Subcutaneous TID WC  . insulin aspart  0-5 Units Subcutaneous QHS  . metFORMIN  500 mg Oral BID WC  . valsartan  320 mg Oral QHS    . sodium chloride 20 mL (03/12/14 0412)  . sodium chloride Stopped (03/13/14 0700)  . nitroGLYCERIN Stopped (03/13/14 0900)    Physical Exam:   General appearance: alert, cooperative and no distress Neck: no adenopathy, no carotid bruit, no JVD, supple, symmetrical, trachea midline and thyroid not enlarged, symmetric, no tenderness/mass/nodules Lungs: clear to auscultation bilaterally Heart: regular rate and rhythm Abdomen: soft, non-tender; bowel sounds normal; no masses,  no organomegaly Extremities: no edema, redness or tenderness in the calves or thighs Pulses: 2+ and symmetric Neuro: nonfocal   Rate: 69  Rhythm: normal sinus rhythm  Lab Results:   Recent Labs  03/13/14 0307  NA 136*  K 3.6*  CL 100  CO2 20  GLUCOSE 222*  BUN 6  CREATININE 0.72     Recent Labs  03/12/14 1245 03/12/14 1830  TROPONINI >20.00* >20.00*    Hepatic Function Panel No results found for this basename: PROT, ALBUMIN, AST, ALT, ALKPHOS, BILITOT, BILIDIR, IBILI,  in the last 72 hours  Recent Labs  03/13/14 0307  INR 1.10    BNP (last 3 results) No results found for this basename: PROBNP,  in the last 8760 hours  Lipid Panel     Component Value Date/Time   CHOL 218* 03/13/2014 0307   TRIG 204* 03/13/2014 0307   HDL 27* 03/13/2014 0307   CHOLHDL 8.1 03/13/2014 0307   VLDL 41* 03/13/2014 0307   LDLCALC 150* 03/13/2014 0307      Imaging:  No results found.    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 EF 30 - 35% range. Life-vest in room. Reviewed cath findings again in detail. SVG to RCA is occluded and there is 90% mid RCA stenosis and occluded PLA with some L to R collaterals. With no recurrent pain will further titrate meds, allow for potential improvement in LV fxn and consider PCI to native RCA if change in symptoms or significant ischemia on medical therapy. Will DC today and f/u with Dr. Stanford Breed.   Marcello Moores  A. Claiborne Billings, MD, [facility] 03/15/2014, 11:56 AM

## 2014-03-22 ENCOUNTER — Encounter: Payer: Self-pay | Admitting: Cardiovascular Disease

## 2014-03-31 ENCOUNTER — Ambulatory Visit (INDEPENDENT_AMBULATORY_CARE_PROVIDER_SITE_OTHER): Payer: BC Managed Care – PPO | Admitting: Cardiology

## 2014-03-31 ENCOUNTER — Encounter: Payer: Self-pay | Admitting: Cardiology

## 2014-03-31 VITALS — BP 140/82 | HR 71 | Ht 67.0 in | Wt 246.5 lb

## 2014-03-31 DIAGNOSIS — I2510 Atherosclerotic heart disease of native coronary artery without angina pectoris: Secondary | ICD-10-CM

## 2014-03-31 DIAGNOSIS — I25119 Atherosclerotic heart disease of native coronary artery with unspecified angina pectoris: Secondary | ICD-10-CM

## 2014-03-31 DIAGNOSIS — I255 Ischemic cardiomyopathy: Secondary | ICD-10-CM

## 2014-03-31 DIAGNOSIS — I209 Angina pectoris, unspecified: Secondary | ICD-10-CM

## 2014-03-31 DIAGNOSIS — Z79899 Other long term (current) drug therapy: Secondary | ICD-10-CM

## 2014-03-31 DIAGNOSIS — I2589 Other forms of chronic ischemic heart disease: Secondary | ICD-10-CM

## 2014-03-31 MED ORDER — CARVEDILOL 25 MG PO TABS: 25.0000 mg | ORAL_TABLET | Freq: Two times a day (BID) | ORAL | Status: DC

## 2014-03-31 MED ORDER — SPIRONOLACTONE 25 MG PO TABS: 25.0000 mg | ORAL_TABLET | Freq: Every day | ORAL | Status: AC

## 2014-03-31 NOTE — Progress Notes (Signed)
Patient ID: William Calderon, male   DOB: [birth year], 53 y.o.   MRN: [id number]    03/31/2014 William Calderon   10/27/1960  [id number]  Primary Physicia William Sleeper, PA-C Primary Cardiologist: Dr. Orpah Melter  HPI: William Calderon presents to clinic today for post hospital followup. He is a 53 year old male who underwent CABG revascularization surgery in 2003 and had a LIMA placed to his LAD, SVG sequentially to his OM 1 and OM 2, SVG to his diagonal vessel and SVG to the posterior lateral branch of his RCA. He has a history of prior CVA and remote history of PE. He is also status post radiation and chemotherapy for non-Hodgkin's lymphoma. He presented to [facility] on 03/12/2014 with severe chest pain and was found to have lateral ST segment elevation in leads 1 and aVL with inferior ST depressions. He underwent urgent left heart catheterization, performed by Dr. Claiborne Billings, which demonstrated an occluded SVG to the OM. He underwent successful percutaneous coronary intervention to the SVG to the diagonal vessel with PTCA and ultimate tandem stenting with 3.5x38 and 3.5x18 mm Xience Alpine DES stents post dilated to 3.70mm. He was noted to have residual high-grade mid RCA stenosis with an occluded SVG with collaterals. It was felt he may need PCI at a later date should he develop recurrent symptoms. He was continued on dual antiplatelet therapy with aspirin and Plavix. A 2-D echo revealed severe left ventricular systolic dysfunction with an ejection fraction of 25-30%. He was discharged home with a LifeVest. Also notable, this admission, syncope diagnosis of diabetes. His hemoglobin A1c was 9.3. Consultation was placed with diabetic coordinator. He was started on glipizide and metformin. He was discharged home on 03/15/2014.  He presents to clinic today for posthospital followup. He is accompanied by his wife. He states he's been doing fairly well since discharge from the hospital. He denies any  recurrent chest pain. He states he has been fully compliant with his medications. He denies any signs/symptoms of acute heart failure, including no dyspnea, orthopnea, PND or lower extremity edema. He denies any palpitations and denies any LifeVest shocks. He states he's been fully compliant with his LifeVest and also fully compliant with all of his prescribed medications. His main complaint today however is a rash that appears throughout his body, under his arms, on his anterior thighs and chest. He denies fevers chills, facial/neck swelling, difficulties breathing. The rash is not pruritic. He states that the rash started shortly after the initiation of his new home medications.    Current Outpatient Prescriptions  Medication Sig Dispense Refill  . ALPRAZolam (XANAX) 0.5 MG tablet Take 0.25-0.5 mg by mouth 2 (two) times daily as needed for anxiety.       Marland Kitchen amLODipine (NORVASC) 10 MG tablet Take 10 mg by mouth daily.      Marland Kitchen aspirin EC 81 MG EC tablet Take 1 tablet (81 mg total) by mouth daily.      Marland Kitchen atorvastatin (LIPITOR) 80 MG tablet Take 1 tablet (80 mg total) by mouth daily at 6 PM.  30 tablet  5  . carvedilol (COREG) 25 MG tablet Take 1 tablet (25 mg total) by mouth 2 (two) times daily with a meal.  60 tablet  5  . Cholecalciferol (VITAMIN D PO) Take 1 tablet by mouth daily.       . clopidogrel (PLAVIX) 75 MG tablet Take 1 tablet (75 mg total) by mouth daily with breakfast.  30 tablet  5  .  glipiZIDE (GLUCOTROL XL) 5 MG 24 hr tablet Take 1 tablet (5 mg total) by mouth daily with breakfast.  30 tablet  5  . isosorbide mononitrate (IMDUR) 30 MG 24 hr tablet Take 1 tablet (30 mg total) by mouth daily.  30 tablet  5  . metFORMIN (GLUCOPHAGE-XR) 500 MG 24 hr tablet Take 1 tablet (500 mg total) by mouth 2 (two) times daily with a meal.  60 tablet  5  . nitroGLYCERIN (NITROSTAT) 0.4 MG SL tablet Place 1 tablet (0.4 mg total) under the tongue every 5 (five) minutes x 3 doses as needed for chest pain.   25 tablet  12  . OMEGA-3 KRILL OIL PO Take 1 capsule by mouth daily.      . valsartan (DIOVAN) 320 MG tablet Take 1 tablet (320 mg total) by mouth at bedtime.  30 tablet  5  . spironolactone (ALDACTONE) 25 MG tablet Take 1 tablet (25 mg total) by mouth daily.  30 tablet  5   No current facility-administered medications for this visit.    Allergies  Allergen Reactions  . Monosodium Glutamate Other (See Comments)    HEADACHE, VISION LOSS    History   Social History  . Marital Status: Married    Spouse Name: N/A    Number of Children: 2  . 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 Smoking status: Former Smoker -- 0.50 packs/day for 30 years    Types: Cigarettes  . Smokeless tobacco: Never Used     Comment: Smokes electronic cigarettes.    . Alcohol Use: No  . Drug Use: No  . Sexual Activity: Yes   Other Topics Concern  . Not on file   Social History Narrative   On disabilty.  No longer smoking.               Review of Systems: General: negative for chills, fever, night sweats or weight changes.  Cardiovascular: negative for chest pain, dyspnea on exertion, edema, orthopnea, palpitations, paroxysmal nocturnal dyspnea or shortness of breath Dermatological: negative for rash Respiratory: negative for cough or wheezing Urologic: negative for hematuria Abdominal: negative for nausea, vomiting, diarrhea, bright red blood per rectum, melena, or hematemesis Neurologic: negative for visual changes, syncope, or dizziness All other systems reviewed and are otherwise negative except as noted above.    Blood pressure 140/82, pulse 71, height 5\' 7"  (1.702 m), weight 246 lb 8 oz (111.812 kg).  General appearance: alert, cooperative, no distress and moderately obese Neck: no carotid bruit and no JVD Lungs: clear to auscultation bilaterally Heart: regular rate and rhythm, S1, S2 normal, no murmur,  click, rub or gallop Extremities: no LEE Pulses: 2+ and symmetric Skin: he has patches of erythematous maculopapular rashes on his proximal anterior thighs, extensor surfaces of his upper arms and torso, directly above his bilateral inguinal folds Neurologic: Grossly normal  EKG NSR. HR 71 bpm  ASSESSMENT AND PLAN:   1. CAD: Status post CABG in 2003. Status post STEMI 03/12/2014 resulting in successful percutaneous coronary intervention to the SVG to the diagonal vessel with PTCA and ultimate tandem stenting, utilizing a DES. He denies any recurrent chest pain. Continue dual antiplatelet therapy with aspirin plus Plavix, as well as beta blocker, ARB, statin and long acting nitrate.  2. Systolic heart failure: EF 25-30%, post STEMI. Continue with LifeVest. He appears euvolemic on physical exam. He  will need to continue diuretic therapy. We discussed the importance of adherence to low-sodium diet, medication compliance as well as daily weights. We will continue him on Coreg and valsartan. His R. Reddy on the maximum dose of valsartan. We will further increase his Coreg to 25 mg twice a day.  3. Rash: Onset shortly after the initiation of several medications, including Lasix. The rash was also examined by our office pharmacist, who also reviewed his medications. It is felt that his rash may be secondary to Lasix, which contains sulfa. It has been recommended that we discontinue Lasix and place him on spironolactone for diuretic therapy. This was also reviewed by Dr. Gwenlyn Found, DOD, and he also agrees with switching him to spironolactone. Have ordered the patient to get repeat lab work in one week (BNP) to assess that potassium and renal function are stable.  4. Diabetes: Hemoglobin A1c was 9.3. Discussed with patient that he needs to work with his PCP, continue prescribed medications and improve diet/increase exercise to help lower levels. His goal hemoglobin A1c should be < 7. Continue management  per  PLAN   Patient appears to be doing fairly well since discharge. As mentioned above, we will increase his Coreg to 25 mg twice a day. We'll discontinue his Lasix and start him on 25 mg of spironolactone. He is to return in one week for repeat lab work and followup. Continue with LifeVest. Continue heart failure management pathway (daily medication compliance, strict sodium restriction and daily weights).   SIMMONS, [place] 03/31/2014 5:41 PM

## 2014-03-31 NOTE — Patient Instructions (Signed)
STOP Furosemide.   START Spironolactone 25mg  once a day.  INCREASE Carvedilol to 25mg  twice a day.  Your physician recommends that you return for lab work in: Tuesday at [facility].  Your physician recommends that you schedule a follow-up appointment in: 2 weeks with Lyda Jester, PA.  Your physician recommends that you schedule a follow-up appointment in: 4 weeks with Dr. Stanford Breed.

## 2014-04-04 LAB — BASIC METABOLIC PANEL
BUN: 5 mg/dL — ABNORMAL LOW (ref 6–23)
CO2: 25 meq/L (ref 19–32)
Calcium: 9.3 mg/dL (ref 8.4–10.5)
Chloride: 102 mEq/L (ref 96–112)
Creat: 0.82 mg/dL (ref 0.50–1.35)
Glucose, Bld: 155 mg/dL — ABNORMAL HIGH (ref 70–99)
POTASSIUM: 3.8 meq/L (ref 3.5–5.3)
Sodium: 137 mEq/L (ref 135–145)

## 2014-04-17 ENCOUNTER — Ambulatory Visit (INDEPENDENT_AMBULATORY_CARE_PROVIDER_SITE_OTHER): Payer: BC Managed Care – PPO | Admitting: Cardiology

## 2014-04-17 ENCOUNTER — Encounter: Payer: Self-pay | Admitting: Cardiology

## 2014-04-17 VITALS — BP 180/90 | HR 73 | Ht 67.0 in | Wt 252.4 lb

## 2014-04-17 DIAGNOSIS — Z951 Presence of aortocoronary bypass graft: Secondary | ICD-10-CM

## 2014-04-17 DIAGNOSIS — C8584 Other specified types of non-Hodgkin lymphoma, lymph nodes of axilla and upper limb: Secondary | ICD-10-CM

## 2014-04-17 DIAGNOSIS — I10 Essential (primary) hypertension: Secondary | ICD-10-CM

## 2014-04-17 DIAGNOSIS — I209 Angina pectoris, unspecified: Secondary | ICD-10-CM

## 2014-04-17 DIAGNOSIS — C8594 Non-Hodgkin lymphoma, unspecified, lymph nodes of axilla and upper limb: Secondary | ICD-10-CM

## 2014-04-17 DIAGNOSIS — I252 Old myocardial infarction: Secondary | ICD-10-CM

## 2014-04-17 DIAGNOSIS — I639 Cerebral infarction, unspecified: Secondary | ICD-10-CM

## 2014-04-17 DIAGNOSIS — I25711 Atherosclerosis of autologous vein coronary artery bypass graft(s) with angina pectoris with documented spasm: Secondary | ICD-10-CM

## 2014-04-17 DIAGNOSIS — I6350 Cerebral infarction due to unspecified occlusion or stenosis of unspecified cerebral artery: Secondary | ICD-10-CM

## 2014-04-17 DIAGNOSIS — R21 Rash and other nonspecific skin eruption: Secondary | ICD-10-CM

## 2014-04-17 DIAGNOSIS — I255 Ischemic cardiomyopathy: Secondary | ICD-10-CM

## 2014-04-17 DIAGNOSIS — I2699 Other pulmonary embolism without acute cor pulmonale: Secondary | ICD-10-CM

## 2014-04-17 DIAGNOSIS — I25810 Atherosclerosis of coronary artery bypass graft(s) without angina pectoris: Secondary | ICD-10-CM

## 2014-04-17 DIAGNOSIS — I2589 Other forms of chronic ischemic heart disease: Secondary | ICD-10-CM

## 2014-04-17 MED ORDER — TICAGRELOR 90 MG PO TABS: 90.0000 mg | ORAL_TABLET | Freq: Two times a day (BID) | ORAL | Status: DC

## 2014-04-17 NOTE — Assessment & Plan Note (Signed)
EF 25-30%, pt on Life Vest

## 2014-04-17 NOTE — Progress Notes (Signed)
04/17/2014 William Calderon   06-07-1961  [id number]  Primary Physicia Terald Sleeper, PA-C Primary Cardiologist: Dr Claiborne Billings  HPI:  Pt is is a 53 year old male who underwent CABG in 2003 and had a LIMA placed to his LAD, SVG sequentially to his OM 1 and OM 2, SVG to his diagonal vessel and SVG to the posterior lateral branch of his RCA. He has a history of prior CVA in 2011 and remote history of PE. He is also status post radiation and chemotherapy for non-Hodgkin's lymphoma 2 years ago. He presented to [facility] on 03/12/2014 with severe chest pain and was found to have a STEMI. He underwent urgent left heart catheterization, performed by Dr. Claiborne Billings, which demonstrated an occluded SVG to the Dx. He underwent successful percutaneous coronary intervention to the SVG to the Dx vessel DES stent. He was noted to have residual high-grade mid RCA stenosis with an occluded SVG with collaterals. It was felt he may need PCI at a later date should he develop recurrent symptoms. He was continued on dual antiplatelet therapy with aspirin and Plavix. A 2-D echo revealed severe left ventricular systolic dysfunction with an ejection fraction of 25-30%. He was discharged home with a LifeVest.             When seen in follow up 03/31/14 he was noted to have a rash which was felt to be secondary to Lasix. This was changed to Aldactone. He is here now for follow up. He says there has been no change in his rash. He denies chest pain or dyspnea.      Current Outpatient Prescriptions  Medication Sig Dispense Refill  . ALPRAZolam (XANAX) 0.5 MG tablet Take 0.25-0.5 mg by mouth 2 (two) times daily as needed for anxiety.       Marland Kitchen amLODipine (NORVASC) 10 MG tablet Take 10 mg by mouth daily.      Marland Kitchen aspirin EC 81 MG EC tablet Take 1 tablet (81 mg total) by mouth daily.      . carvedilol (COREG) 25 MG tablet Take 1 tablet (25 mg total) by mouth 2 (two) times daily with a meal.  60 tablet  5  . clopidogrel (PLAVIX)  75 MG tablet Take 1 tablet (75 mg total) by mouth daily with breakfast.  30 tablet  5  . Fish Oil-Cholecalciferol (OMEGA-3 FISH OIL-VITAMIN D3) 1200-1000 MG-UNIT CAPS Take by mouth.      Marland Kitchen glipiZIDE (GLUCOTROL XL) 5 MG 24 hr tablet Take 1 tablet (5 mg total) by mouth daily with breakfast.  30 tablet  5  . isosorbide mononitrate (IMDUR) 30 MG 24 hr tablet Take 1 tablet (30 mg total) by mouth daily.  30 tablet  5  . nitroGLYCERIN (NITROSTAT) 0.4 MG SL tablet Place 1 tablet (0.4 mg total) under the tongue every 5 (five) minutes x 3 doses as needed for chest pain.  25 tablet  12  . spironolactone (ALDACTONE) 25 MG tablet Take 1 tablet (25 mg total) by mouth daily.  30 tablet  5  . valsartan (DIOVAN) 320 MG tablet Take 1 tablet (320 mg total) by mouth at bedtime.  30 tablet  5  . ticagrelor (BRILINTA) 90 MG TABS tablet Take 1 tablet (90 mg total) by mouth 2 (two) times daily.  60 tablet  2   No current facility-administered medications for this visit.    Allergies  Allergen Reactions  . Monosodium Glutamate Other (See Comments)    HEADACHE, VISION LOSS  History   Social History  . Marital Status: Married    Spouse Name: N/A    Number of Children: 2  . 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 Smoking status: Former Smoker -- 0.50 packs/day for 30 years    Types: Cigarettes  . Smokeless tobacco: Never Used     Comment: Smokes electronic cigarettes.    . Alcohol Use: No  . Drug Use: No  . Sexual Activity: Yes   Other Topics Concern  . Not on file   Social History Narrative   On disabilty.  No longer smoking.               Review of Systems: General: negative for chills, fever, night sweats or weight changes.  Cardiovascular: negative for chest pain, dyspnea on exertion, edema, orthopnea, palpitations, paroxysmal nocturnal dyspnea or shortness of breath Respiratory: negative for cough or  wheezing Urologic: negative for hematuria Abdominal: negative for nausea, vomiting, diarrhea, bright red blood per rectum, melena, or hematemesis Neurologic: negative for visual changes, syncope, or dizziness All other systems reviewed and are otherwise negative except as noted above.    Blood pressure 180/90, pulse 73, height 5\' 7"  (1.702 m), weight 252 lb 6.4 oz (114.488 kg).  General appearance: alert, cooperative, no distress and mildly obese Lungs: clear to auscultation bilaterally Heart: regular rate and rhythm Skin: puritic rash on trunk, arms, and thighs    ASSESSMENT AND PLAN:   CAD (coronary artery disease) STEMI 03/12/14- SVG-DX DES, residual native RCA disease with an occluded graft  Cardiomyopathy, ischemic EF 25-30%, pt on Life Vest  Hypertension Repeat B/P 152/82  Hx of CABG 2003 Dr Cyndia Bent  Non-Hodgkin's lymphoma of axilla Treated two years ago, doing well  CVA (cerebral vascular accident) 11/2009, pt says he took Plavix for a few months then  Pulmonary embolism and infarction 2013  Rash He has had a rash since he left the hospital. Lasix changed to Aldactone without change, Coreg is new, he was on Metoprolol.    PLAN  I gave his samples of Brilinta. He will stop his Plavix and try Brilinta. If his rash does not improve he can resume Plavix and then change his Coreg back to Metoprolol (this was the only other new medication). He will need an echo in Sept.  Kerin Ransom KPA-C 04/17/2014 3:29 PM

## 2014-04-17 NOTE — Assessment & Plan Note (Deleted)
1999, no residual, Pt says he took Plavix then for 6 months

## 2014-04-17 NOTE — Assessment & Plan Note (Signed)
Repeat B/P 152/82

## 2014-04-17 NOTE — Assessment & Plan Note (Signed)
2013 

## 2014-04-17 NOTE — Assessment & Plan Note (Signed)
11/2009, pt says he took Plavix for a few months then

## 2014-04-17 NOTE — Assessment & Plan Note (Signed)
He has had a rash since he left the hospital. Lasix changed to Aldactone without change, Coreg is new, he was on Metoprolol.

## 2014-04-17 NOTE — Assessment & Plan Note (Addendum)
STEMI 03/12/14- SVG-DX DES, residual native RCA disease with an occluded graft

## 2014-04-17 NOTE — Patient Instructions (Addendum)
Kerin Ransom, PA-C, has recommended making the following medication changes:  STOP Plavix  START Brilinta TOMORROW - take 1 tablet twice daily  Please call us back in 1 week to let us know about your rash. Ask for [place]. If the rash goes away with Brilinta, continue Brilinta and use the savings card! If the rash does not go away, restart the Plavix, and Lurena Joiner will change the Coreg to Metoprolol.  Your physician recommends that you schedule a follow-up appointment in 6 weeks with Dr Corky Downs or Kerin Ransom, PA-C.

## 2014-04-17 NOTE — Assessment & Plan Note (Signed)
2003 Dr Cyndia Bent

## 2014-04-17 NOTE — Assessment & Plan Note (Signed)
Treated two years ago, doing well

## 2014-04-25 ENCOUNTER — Telehealth: Payer: Self-pay | Admitting: Cardiology

## 2014-04-25 NOTE — Telephone Encounter (Signed)
Spoke with pt, he wanted to let us know the rash he had from plavix has gone away since starting the brilinta. He also reports he is going to have to return his life vest, bcbs has denied. Will contact kelly lanier for her help.

## 2014-04-25 NOTE — Telephone Encounter (Signed)
Follow Up ° ° ° ° °Pt is returning call from earlier. Please call. °

## 2014-06-06 ENCOUNTER — Encounter: Payer: Self-pay | Admitting: Cardiology

## 2014-06-06 ENCOUNTER — Ambulatory Visit (INDEPENDENT_AMBULATORY_CARE_PROVIDER_SITE_OTHER): Payer: BC Managed Care – PPO | Admitting: Cardiology

## 2014-06-06 ENCOUNTER — Ambulatory Visit (HOSPITAL_COMMUNITY)
Admission: RE | Admit: 2014-06-06 | Discharge: 2014-06-06 | Disposition: A | Discharge status: Home / Self Care | Payer: BC Managed Care – PPO | Source: Ambulatory Visit | Attending: Cardiology | Admitting: Cardiology

## 2014-06-06 VITALS — BP 132/90 | HR 68 | Ht 67.0 in | Wt 259.5 lb

## 2014-06-06 DIAGNOSIS — I25810 Atherosclerosis of coronary artery bypass graft(s) without angina pectoris: Secondary | ICD-10-CM

## 2014-06-06 DIAGNOSIS — Z87898 Personal history of other specified conditions: Secondary | ICD-10-CM | POA: Insufficient documentation

## 2014-06-06 DIAGNOSIS — I2699 Other pulmonary embolism without acute cor pulmonale: Secondary | ICD-10-CM

## 2014-06-06 DIAGNOSIS — E785 Hyperlipidemia, unspecified: Secondary | ICD-10-CM

## 2014-06-06 DIAGNOSIS — I10 Essential (primary) hypertension: Secondary | ICD-10-CM

## 2014-06-06 DIAGNOSIS — Z87891 Personal history of nicotine dependence: Secondary | ICD-10-CM | POA: Insufficient documentation

## 2014-06-06 DIAGNOSIS — C8584 Other specified types of non-Hodgkin lymphoma, lymph nodes of axilla and upper limb: Secondary | ICD-10-CM

## 2014-06-06 DIAGNOSIS — Z9221 Personal history of antineoplastic chemotherapy: Secondary | ICD-10-CM | POA: Insufficient documentation

## 2014-06-06 DIAGNOSIS — Z923 Personal history of irradiation: Secondary | ICD-10-CM | POA: Diagnosis not present

## 2014-06-06 DIAGNOSIS — R21 Rash and other nonspecific skin eruption: Secondary | ICD-10-CM

## 2014-06-06 DIAGNOSIS — Z951 Presence of aortocoronary bypass graft: Secondary | ICD-10-CM | POA: Insufficient documentation

## 2014-06-06 DIAGNOSIS — I2109 ST elevation (STEMI) myocardial infarction involving other coronary artery of anterior wall: Secondary | ICD-10-CM

## 2014-06-06 DIAGNOSIS — I2589 Other forms of chronic ischemic heart disease: Secondary | ICD-10-CM

## 2014-06-06 DIAGNOSIS — Z8673 Personal history of transient ischemic attack (TIA), and cerebral infarction without residual deficits: Secondary | ICD-10-CM | POA: Diagnosis not present

## 2014-06-06 DIAGNOSIS — I252 Old myocardial infarction: Secondary | ICD-10-CM | POA: Insufficient documentation

## 2014-06-06 DIAGNOSIS — C8594 Non-Hodgkin lymphoma, unspecified, lymph nodes of axilla and upper limb: Secondary | ICD-10-CM

## 2014-06-06 DIAGNOSIS — I2510 Atherosclerotic heart disease of native coronary artery without angina pectoris: Secondary | ICD-10-CM

## 2014-06-06 DIAGNOSIS — I255 Ischemic cardiomyopathy: Secondary | ICD-10-CM

## 2014-06-06 DIAGNOSIS — Z7901 Long term (current) use of anticoagulants: Secondary | ICD-10-CM | POA: Diagnosis not present

## 2014-06-06 DIAGNOSIS — Z79899 Other long term (current) drug therapy: Secondary | ICD-10-CM

## 2014-06-06 DIAGNOSIS — Z86711 Personal history of pulmonary embolism: Secondary | ICD-10-CM | POA: Diagnosis not present

## 2014-06-06 MED ORDER — ROSUVASTATIN CALCIUM 5 MG PO TABS: 5.0000 mg | ORAL_TABLET | ORAL | Status: DC

## 2014-06-06 NOTE — Progress Notes (Signed)
2D Echocardiogram Complete.  06/06/2014   William Calderon, [place]

## 2014-06-06 NOTE — Assessment & Plan Note (Signed)
Untreated- LDL 150

## 2014-06-06 NOTE — Assessment & Plan Note (Signed)
Radiation Aug 2013

## 2014-06-06 NOTE — Patient Instructions (Signed)
Kerin Ransom, PA-C has recommended making the following medication changes:  START Crestor 5 mg - take 1 tablet twice weekly  Your physician has ordered FASTING blood work TO BE DONE IN 3 MONTHS.  Your physician recommends that you schedule a follow-up appointment in  4 months with Dr Stanford Breed.

## 2014-06-06 NOTE — Assessment & Plan Note (Signed)
Aug 2013- Rx'd with 6 mos of Xarelto

## 2014-06-06 NOTE — Assessment & Plan Note (Signed)
Turns out this was most likely secondary to Plavix.

## 2014-06-06 NOTE — Assessment & Plan Note (Addendum)
He was discharged with a Life Vest after his MI in June. Echo today shows an EF of 58%

## 2014-06-06 NOTE — Progress Notes (Signed)
06/06/2014 [name]   [birth year]  [id number]  Primary Physicia William Sleeper, PA-C Primary Cardiologist: Dr William Calderon  HPI:  53 year old male from [facility] who underwent CABG in 2003 and had a LIMA placed to his LAD, SVG sequentially to his OM 1 and OM 2, SVG to his diagonal vessel and SVG to the posterior lateral branch of his RCA. He has a history of prior CVA in 2011. He is also status post radiation and chemotherapy for non-Hodgkin's lymphoma 2 years ago. He had a DVT and PE at that time Rx'd with 6 mos of Xarelto.           He presented to [facility] on 03/12/2014 with a STEMI. He underwent successful percutaneous coronary intervention to the SVG to the Dx vessel DES stent. He was noted to have residual high-grade mid RCA stenosis with an occluded SVG with collaterals. It was felt he may need PCI at a later date should he develop recurrent symptoms. He was continued on dual antiplatelet therapy with aspirin and Plavix. A 2-D echo revealed severe left ventricular systolic dysfunction with an ejection fraction of 25-30%. He was discharged home with a LifeVest  After discharge he developed a rash and his Lasix was stopped but his rash persisted. I stopped his Plavix and changed him to Brilinta and his rash resolved. He has had trouble with his insurance company over his [employer] education [employer] and this has caused him stress. Hew as approved through today's visit. He has stopped smoking. He says he feels like he has more energy. He has no angina.     Current Outpatient Prescriptions  Medication Sig Dispense Refill  . ALPRAZolam (XANAX) 0.5 MG tablet Take 0.25-0.5 mg by mouth 2 (two) times daily as needed for anxiety.       Marland Kitchen amLODipine (NORVASC) 10 MG tablet Take 10 mg by mouth daily.      Marland Kitchen aspirin EC 81 MG EC tablet Take 1 tablet (81 mg total) by mouth daily.      . carvedilol (COREG) 25 MG tablet Take 1 tablet (25 mg total) by mouth 2 (two) times daily with a meal.  60 tablet  5   . Fish Oil-Cholecalciferol (OMEGA-3 FISH OIL-VITAMIN D3) 1200-1000 MG-UNIT CAPS Take by mouth.      Marland Kitchen glipiZIDE (GLUCOTROL XL) 5 MG 24 hr tablet Take 1 tablet (5 mg total) by mouth daily with breakfast.  30 tablet  5  . isosorbide mononitrate (IMDUR) 30 MG 24 hr tablet Take 1 tablet (30 mg total) by mouth daily.  30 tablet  5  . nitroGLYCERIN (NITROSTAT) 0.4 MG SL tablet Place 1 tablet (0.4 mg total) under the tongue every 5 (five) minutes x 3 doses as needed for chest pain.  25 tablet  12  . spironolactone (ALDACTONE) 25 MG tablet Take 1 tablet (25 mg total) by mouth daily.  30 tablet  5  . ticagrelor (BRILINTA) 90 MG TABS tablet Take 1 tablet (90 mg total) by mouth 2 (two) times daily.  60 tablet  2  . valsartan (DIOVAN) 320 MG tablet Take 1 tablet (320 mg total) by mouth at bedtime.  30 tablet  5   No current facility-administered medications for this visit.    Allergies  Allergen Reactions  . Monosodium Glutamate Other (See Comments)    HEADACHE, VISION LOSS  . Plavix [Clopidogrel] Rash  . Lipitor [Atorvastatin] Other (See Comments)    Myalgia    History  Social History  . Marital Status: Married    Spouse Name: N/A    Number of Children: 2  . 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 Smoking status: Former Smoker -- 0.50 packs/day for 30 years    Types: Cigarettes  . Smokeless tobacco: Never Used     Comment: Smokes electronic cigarettes.    . Alcohol Use: No  . Drug Use: No  . Sexual Activity: Yes   Other Topics Concern  . Not on file   Social History Narrative   On disabilty.  No longer smoking.               Review of Systems: General: negative for chills, fever, night sweats or weight changes.  Cardiovascular: negative for chest pain, dyspnea on exertion, edema, orthopnea, palpitations, paroxysmal nocturnal dyspnea or shortness of breath Dermatological: negative for  rash Respiratory: negative for cough or wheezing Urologic: negative for hematuria Abdominal: negative for nausea, vomiting, diarrhea, bright red blood per rectum, melena, or hematemesis Neurologic: negative for visual changes, syncope, or dizziness All other systems reviewed and are otherwise negative except as noted above.    Blood pressure 132/90, pulse 68, height 5\' 7"  (1.702 m), weight 259 lb 8 oz (117.708 kg).  General appearance: alert, cooperative, no distress and mildly obese Lungs: clear to auscultation bilaterally Heart: regular rate and rhythm Extremities: no edema    ASSESSMENT AND PLAN:   Cardiomyopathy, ischemic He was discharged with a Life Vest after his MI in June. Echo today shows an EF of 58%  CAD (coronary artery disease) STEMI-SVG-Dx DES 03/12/14  Hyperlipidemia Untreated- LDL 150  Rash Turns out this was most likely secondary to Plavix.  Pulmonary embolism and infarction Aug 2013- Rx'd with 6 mos of Xarelto  Non-Hodgkin's lymphoma of axilla Radiation Aug 2013  Hypertension Controlled   PLAN  I obtained a limited  echo today for LVF as it is close to 3 months since his MI and PCI. His EF has improved to "58%" per echo tech. We had a long discussion about statin therapy. He had myalgias in the past on Lipitor and  was told that "Lipitor destroyed your gallbladder" when he had a cholecystectomy.  He agreed to try Crestor 5 mg twice a week. He'll have lipids and a Cmet in 3 months and see Dr William Calderon in 4 months.   William Calderon KPA-C 06/06/2014 3:12 PM

## 2014-06-06 NOTE — Assessment & Plan Note (Signed)
STEMI-SVG-Dx DES 03/12/14

## 2014-06-06 NOTE — Assessment & Plan Note (Signed)
Controlled.  

## 2014-07-21 ENCOUNTER — Encounter: Payer: Self-pay | Admitting: Hematology and Oncology

## 2014-07-21 ENCOUNTER — Other Ambulatory Visit (HOSPITAL_BASED_OUTPATIENT_CLINIC_OR_DEPARTMENT_OTHER): Payer: BC Managed Care – PPO

## 2014-07-21 ENCOUNTER — Ambulatory Visit (HOSPITAL_BASED_OUTPATIENT_CLINIC_OR_DEPARTMENT_OTHER): Payer: BC Managed Care – PPO | Admitting: Hematology and Oncology

## 2014-07-21 ENCOUNTER — Telehealth: Payer: Self-pay | Admitting: Hematology and Oncology

## 2014-07-21 VITALS — BP 140/76 | HR 64 | Temp 97.5°F | Resp 18 | Ht 67.0 in | Wt 265.4 lb

## 2014-07-21 DIAGNOSIS — C8594 Non-Hodgkin lymphoma, unspecified, lymph nodes of axilla and upper limb: Secondary | ICD-10-CM

## 2014-07-21 DIAGNOSIS — I2584 Coronary atherosclerosis due to calcified coronary lesion: Secondary | ICD-10-CM

## 2014-07-21 DIAGNOSIS — C8280 Other types of follicular lymphoma, unspecified site: Secondary | ICD-10-CM

## 2014-07-21 DIAGNOSIS — I2510 Atherosclerotic heart disease of native coronary artery without angina pectoris: Secondary | ICD-10-CM

## 2014-07-21 LAB — CBC WITH DIFFERENTIAL/PLATELET
BASO%: 0.6 % (ref 0.0–2.0)
BASOS ABS: 0.1 10*3/uL (ref 0.0–0.1)
EOS ABS: 0.3 10*3/uL (ref 0.0–0.5)
EOS%: 2.8 % (ref 0.0–7.0)
HCT: 44.6 % (ref 38.4–49.9)
HEMOGLOBIN: 15.5 g/dL (ref 13.0–17.1)
LYMPH%: 22.6 % (ref 14.0–49.0)
MCH: 30.9 pg (ref 27.2–33.4)
MCHC: 34.8 g/dL (ref 32.0–36.0)
MCV: 89 fL (ref 79.3–98.0)
MONO#: 0.6 10*3/uL (ref 0.1–0.9)
MONO%: 6.8 % (ref 0.0–14.0)
NEUT%: 67.2 % (ref 39.0–75.0)
NEUTROS ABS: 6.1 10*3/uL (ref 1.5–6.5)
Platelets: 179 10*3/uL (ref 140–400)
RBC: 5.01 10*6/uL (ref 4.20–5.82)
RDW: 13.4 % (ref 11.0–14.6)
WBC: 9 10*3/uL (ref 4.0–10.3)
lymph#: 2 10*3/uL (ref 0.9–3.3)

## 2014-07-21 LAB — COMPREHENSIVE METABOLIC PANEL (CC13)
ALBUMIN: 3.7 g/dL (ref 3.5–5.0)
ALT: 23 U/L (ref 0–55)
AST: 15 U/L (ref 5–34)
Alkaline Phosphatase: 62 U/L (ref 40–150)
Anion Gap: 11 mEq/L (ref 3–11)
BUN: 8.6 mg/dL (ref 7.0–26.0)
CALCIUM: 9.5 mg/dL (ref 8.4–10.4)
CHLORIDE: 107 meq/L (ref 98–109)
CO2: 20 mEq/L — ABNORMAL LOW (ref 22–29)
CREATININE: 0.9 mg/dL (ref 0.7–1.3)
GLUCOSE: 141 mg/dL — AB (ref 70–140)
POTASSIUM: 3.7 meq/L (ref 3.5–5.1)
Sodium: 138 mEq/L (ref 136–145)
Total Bilirubin: 0.83 mg/dL (ref 0.20–1.20)
Total Protein: 6.7 g/dL (ref 6.4–8.3)

## 2014-07-21 LAB — LACTATE DEHYDROGENASE (CC13): LDH: 177 U/L (ref 125–245)

## 2014-07-21 NOTE — Telephone Encounter (Signed)
LM to confirm Oct 2016 appt. Mailed Cal.

## 2014-07-22 NOTE — Progress Notes (Signed)
[facility] OFFICE PROGRESS NOTE  Patient Care Team: Terald Sleeper, PA-C as PCP - General (General Practice) Lelon Perla, MD as Consulting Physician (Cardiology) Eppie Gibson, MD as Consulting Physician (Radiation Oncology) Donnie Mesa, MD as Consulting Physician (General Surgery) Heath Lark, MD as Consulting Physician (Hematology and Oncology)  SUMMARY OF ONCOLOGIC HISTORY: He was diagnosed with stage IB, grade 3, follicular center, non-Hodgkin's lymphoma presenting with an isolated large right axillary lymph node mass in April 2013. Due to advanced coronary artery disease with baseline ejection fraction 40%, he was treated with a non-anthracycline containing regimen: Cytoxan, etoposide, vincristine, and prednisone plus Rituxan. He received 4 cycles between 03/09/2012 and 05/12/2012 and then received involved field radiation 30.6 gray between September 9 and 07/06/2012. He achieved a complete PET response.   He developed a left lower extremity DVT and bilateral pulmonary emboli just a few weeks after completing chemotherapy. He was treated with Xarelto for approximately 6 months. He stopped without asking for any medical advice in January of 2014. In June 2015, the patient suffered from heart attack. INTERVAL HISTORY: Please see below for problem oriented charting. He is feeling fine without any recurrence of lymphadenopathy. He denies recent chest pain or shortness of breath.  REVIEW OF SYSTEMS:   Constitutional: Denies fevers, chills or abnormal weight loss Eyes: Denies blurriness of vision Ears, nose, mouth, throat, and face: Denies mucositis or sore throat Respiratory: Denies cough, dyspnea or wheezes Cardiovascular: Denies palpitation, chest discomfort or lower extremity swelling Gastrointestinal:  Denies nausea, heartburn or change in bowel habits Skin: Denies abnormal skin rashes Lymphatics: Denies new lymphadenopathy or easy bruising Neurological:Denies  numbness, tingling or new weaknesses Behavioral/Psych: Mood is stable, no new changes  All other systems were reviewed with the patient and are negative.  I have reviewed the past medical history, past surgical history, social history and family history with the patient and they are unchanged from previous note.  ALLERGIES:  is allergic to monosodium glutamate; plavix; and lipitor.  MEDICATIONS:  Current Outpatient Prescriptions  Medication Sig Dispense Refill  . ALPRAZolam (XANAX) 0.5 MG tablet Take 0.25-0.5 mg by mouth 2 (two) times daily as needed for anxiety.       Marland Kitchen amLODipine (NORVASC) 10 MG tablet Take 10 mg by mouth daily.      Marland Kitchen aspirin EC 81 MG EC tablet Take 1 tablet (81 mg total) by mouth daily.      . carvedilol (COREG) 25 MG tablet Take 1 tablet (25 mg total) by mouth 2 (two) times daily with a meal.  60 tablet  5  . Fish Oil-Cholecalciferol (OMEGA-3 FISH OIL-VITAMIN D3) 1200-1000 MG-UNIT CAPS Take by mouth.      Marland Kitchen glipiZIDE (GLUCOTROL XL) 5 MG 24 hr tablet Take 1 tablet (5 mg total) by mouth daily with breakfast.  30 tablet  5  . isosorbide mononitrate (IMDUR) 30 MG 24 hr tablet Take 1 tablet (30 mg total) by mouth daily.  30 tablet  5  . nitroGLYCERIN (NITROSTAT) 0.4 MG SL tablet Place 1 tablet (0.4 mg total) under the tongue every 5 (five) minutes x 3 doses as needed for chest pain.  25 tablet  12  . rosuvastatin (CRESTOR) 5 MG tablet Take 1 tablet (5 mg total) by mouth 2 (two) times a week.  28 tablet  0  . spironolactone (ALDACTONE) 25 MG tablet Take 1 tablet (25 mg total) by mouth daily.  30 tablet  5  . ticagrelor (BRILINTA) 90 MG TABS  tablet Take 1 tablet (90 mg total) by mouth 2 (two) times daily.  60 tablet  2  . valsartan (DIOVAN) 320 MG tablet Take 1 tablet (320 mg total) by mouth at bedtime.  30 tablet  5   No current facility-administered medications for this visit.    PHYSICAL EXAMINATION: ECOG PERFORMANCE STATUS: 1 - Symptomatic but completely  ambulatory  Filed Vitals:   07/21/14 1247  BP: 140/76  Pulse: 64  Temp: 97.5 F (36.4 C)  Resp: 18   Filed Weights   07/21/14 1247  Weight: 265 lb 6.4 oz (120.385 kg)    GENERAL:alert, no distress and comfortable. He is morbidly obese SKIN: skin color, texture, turgor are normal, no rashes or significant lesions EYES: normal, Conjunctiva are pink and non-injected, sclera clear OROPHARYNX:no exudate, no erythema and lips, buccal mucosa, and tongue normal  NECK: supple, thyroid normal size, non-tender, without nodularity LYMPH:  no palpable lymphadenopathy in the cervical, axillary or inguinal LUNGS: clear to auscultation and percussion with normal breathing effort HEART: regular rate & rhythm and no murmurs and no lower extremity edema ABDOMEN:abdomen soft, non-tender and normal bowel sounds Musculoskeletal:no cyanosis of digits and no clubbing  NEURO: alert & oriented x 3 with fluent speech, no focal motor/sensory deficits  LABORATORY DATA:  I have reviewed the data as listed    Component Value Date/Time   NA 138 07/21/2014 1221   NA 137 04/04/2014 0942   K 3.7 07/21/2014 1221   K 3.8 04/04/2014 0942   CL 102 04/04/2014 0942   CL 103 01/24/2013 0806   CO2 20* 07/21/2014 1221   CO2 25 04/04/2014 0942   GLUCOSE 141* 07/21/2014 1221   GLUCOSE 155* 04/04/2014 0942   GLUCOSE 208* 01/24/2013 0806   BUN 8.6 07/21/2014 1221   BUN 5* 04/04/2014 0942   CREATININE 0.9 07/21/2014 1221   CREATININE 0.82 04/04/2014 0942   CREATININE 0.72 03/13/2014 0307   CALCIUM 9.5 07/21/2014 1221   CALCIUM 9.3 04/04/2014 0942   PROT 6.7 07/21/2014 1221   PROT 7.4 03/12/2014 0359   ALBUMIN 3.7 07/21/2014 1221   ALBUMIN 4.1 03/12/2014 0359   AST 15 07/21/2014 1221   AST 19 03/12/2014 0359   ALT 23 07/21/2014 1221   ALT 25 03/12/2014 0359   ALKPHOS 62 07/21/2014 1221   ALKPHOS 87 03/12/2014 0359   BILITOT 0.83 07/21/2014 1221   BILITOT 0.5 03/12/2014 0359   GFRNONAA >90 03/13/2014 0307   GFRAA >90 03/13/2014 0307     No results found for this basename: SPEP,  UPEP,   kappa and lambda light chains    Lab Results  Component Value Date   WBC 9.0 07/21/2014   NEUTROABS 6.1 07/21/2014   HGB 15.5 07/21/2014   HCT 44.6 07/21/2014   MCV 89.0 07/21/2014   PLT 179 07/21/2014      Chemistry      Component Value Date/Time   NA 138 07/21/2014 1221   NA 137 04/04/2014 0942   K 3.7 07/21/2014 1221   K 3.8 04/04/2014 0942   CL 102 04/04/2014 0942   CL 103 01/24/2013 0806   CO2 20* 07/21/2014 1221   CO2 25 04/04/2014 0942   BUN 8.6 07/21/2014 1221   BUN 5* 04/04/2014 0942   CREATININE 0.9 07/21/2014 1221   CREATININE 0.82 04/04/2014 0942   CREATININE 0.72 03/13/2014 0307      Component Value Date/Time   CALCIUM 9.5 07/21/2014 1221   CALCIUM 9.3 04/04/2014 0942   ALKPHOS  62 07/21/2014 1221   ALKPHOS 87 03/12/2014 0359   AST 15 07/21/2014 1221   AST 19 03/12/2014 0359   ALT 23 07/21/2014 1221   ALT 25 03/12/2014 0359   BILITOT 0.83 07/21/2014 1221   BILITOT 0.5 03/12/2014 0359      ASSESSMENT & PLAN:  Non-Hodgkin's lymphoma of axilla Clinically he has no signs of disease recurrence. Plan to see him back history, physical examination and blood work only and defer imaging study in the future, to be done only if there is clinical suspicion for disease recurrence  CAD (coronary artery disease) He suffered from recent heart attack. He is currently on progressive medical management and dual antiplatelet agents.    Orders Placed This Encounter  Procedures  . CBC with Differential    Standing Status: Future     Number of Occurrences:      Standing Expiration Date: 08/25/2015  . Comprehensive metabolic panel    Standing Status: Future     Number of Occurrences:      Standing Expiration Date: 08/25/2015  . Lactate dehydrogenase    Standing Status: Future     Number of Occurrences:      Standing Expiration Date: 08/25/2015   All questions were answered. The patient knows to call the clinic with any  problems, questions or concerns. No barriers to learning was detected. I spent 15 minutes counseling the patient face to face. The total time spent in the appointment was 20 minutes and more than 50% was on counseling and review of test results     [facility], [place], MD 07/22/2014 8:28 PM

## 2014-07-22 NOTE — Assessment & Plan Note (Signed)
He suffered from recent heart attack. He is currently on progressive medical management and dual antiplatelet agents.

## 2014-07-22 NOTE — Assessment & Plan Note (Signed)
Clinically he has no signs of disease recurrence. Plan to see him back history, physical examination and blood work only and defer imaging study in the future, to be done only if there is clinical suspicion for disease recurrence

## 2014-08-04 ENCOUNTER — Other Ambulatory Visit: Payer: Self-pay | Admitting: Cardiology

## 2014-09-14 ENCOUNTER — Encounter (HOSPITAL_COMMUNITY): Payer: Self-pay | Admitting: Cardiovascular Disease
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
## 2014-10-09 ENCOUNTER — Telehealth: Payer: Self-pay | Admitting: Cardiology

## 2014-10-09 NOTE — Telephone Encounter (Signed)
Pt still have not received his Isosorbide,Valsaratan and Glipizide. Please call this in today to [place].

## 2014-10-10 ENCOUNTER — Other Ambulatory Visit: Payer: Self-pay | Admitting: *Deleted

## 2014-10-10 MED ORDER — VALSARTAN 320 MG PO TABS: 320.0000 mg | ORAL_TABLET | Freq: Every day | ORAL | Status: DC

## 2014-10-10 MED ORDER — ISOSORBIDE MONONITRATE ER 30 MG PO TB24: 30.0000 mg | ORAL_TABLET | Freq: Every day | ORAL | Status: DC

## 2014-10-10 MED ORDER — GLIPIZIDE ER 5 MG PO TB24: 5.0000 mg | ORAL_TABLET | Freq: Every day | ORAL | Status: DC

## 2014-10-13 ENCOUNTER — Encounter: Payer: Self-pay | Admitting: Cardiology

## 2014-10-13 ENCOUNTER — Telehealth: Payer: Self-pay | Admitting: Cardiology

## 2014-10-13 MED ORDER — CLOPIDOGREL BISULFATE 75 MG PO TABS: 75.0000 mg | ORAL_TABLET | Freq: Every day | ORAL | Status: AC

## 2014-10-13 MED ORDER — LISINOPRIL 20 MG PO TABS: 20.0000 mg | ORAL_TABLET | Freq: Every day | ORAL | Status: DC

## 2014-10-13 NOTE — Telephone Encounter (Signed)
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 He states he picked  Healthcare plan because he unemployed. The price of Brilinta would be $400 month, even at [facility] it will $300  a month  Patient was not sure about the price of Valsartan.  RN asked patient if he could find out what was on his formulary tier 1 for each medication. He states he would and call back today

## 2014-10-13 NOTE — Telephone Encounter (Signed)
Returning Call to [place] . Please call    Thanks.Marland Kitchen

## 2014-10-13 NOTE — Telephone Encounter (Signed)
This encounter was created in error - please disregard.

## 2014-10-13 NOTE — Telephone Encounter (Signed)
Forward to [employer] RN

## 2014-10-13 NOTE — Telephone Encounter (Signed)
Pt called in stating that his insurance has changed and he can no longer afford his valsartan and Brilinta . He would like to know if there are any alternative medications he can take. Please call  thanks

## 2014-10-13 NOTE — Addendum Note (Signed)
Addended by: Cristopher Estimable on: 10/13/2014 03:52 PM   Modules accepted: Orders, Medications

## 2014-10-13 NOTE — Telephone Encounter (Signed)
Left message for pt to call.

## 2014-10-13 NOTE — Telephone Encounter (Signed)
DC brilinta and begin plavix 75 mg daily; if rash returns, call. DC valsartan; lisinopril 20 mg daily Kirk Ruths

## 2014-10-13 NOTE — Telephone Encounter (Signed)
Patient return call. Insurance tier is clopidogrel. patient states he had tried this before -develop a rash but he had started all new medication at the same time. He would like to try again if possible. If not something else cheaper. Valsartan does not have lower tier equivalent per insurance company.Patient would like something cheaper Patient states if valsartan is what Dr Stanford Breed wants call i into [facility] , If Dr Stanford Breed changes please call  Into [facility]

## 2014-10-13 NOTE — Telephone Encounter (Signed)
Spoke with pt, Aware of dr Jacalyn Lefevre recommendations.  New scripts sent to the pharmacy.

## 2014-10-13 NOTE — Telephone Encounter (Signed)
Will forward for dr crenshaw review  
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
## 2014-11-13 ENCOUNTER — Ambulatory Visit (INDEPENDENT_AMBULATORY_CARE_PROVIDER_SITE_OTHER): Payer: 59 | Admitting: Cardiology

## 2014-11-13 ENCOUNTER — Encounter: Payer: Self-pay | Admitting: Cardiology

## 2014-11-13 VITALS — BP 168/84 | HR 69 | Ht 67.0 in | Wt 274.0 lb

## 2014-11-13 DIAGNOSIS — I255 Ischemic cardiomyopathy: Secondary | ICD-10-CM

## 2014-11-13 DIAGNOSIS — E785 Hyperlipidemia, unspecified: Secondary | ICD-10-CM

## 2014-11-13 DIAGNOSIS — I10 Essential (primary) hypertension: Secondary | ICD-10-CM

## 2014-11-13 DIAGNOSIS — I2510 Atherosclerotic heart disease of native coronary artery without angina pectoris: Secondary | ICD-10-CM

## 2014-11-13 DIAGNOSIS — Z720 Tobacco use: Secondary | ICD-10-CM

## 2014-11-13 DIAGNOSIS — I2583 Coronary atherosclerosis due to lipid rich plaque: Secondary | ICD-10-CM

## 2014-11-13 MED ORDER — PRAVASTATIN SODIUM 40 MG PO TABS: 40.0000 mg | ORAL_TABLET | Freq: Every evening | ORAL | Status: AC

## 2014-11-13 NOTE — Patient Instructions (Signed)
Your physician wants you to follow-up in: 6 MONTHS WITH DR CRENSHAW You will receive a reminder letter in the mail two months in advance. If you don't receive a letter, please call our office to schedule the follow-up appointment.   START PRAVASTATIN 40 MG ONCE DAILY  Your physician recommends that you return for lab work in: 4 WEEKS- DO NOT EAT PRIOR TO LAB WORK 

## 2014-11-13 NOTE — Assessment & Plan Note (Signed)
Patient has discontinued. 

## 2014-11-13 NOTE — Assessment & Plan Note (Signed)
Patient did not tolerate Lipitor or Crestor. I will try Pravachol 40 mg daily. Check lipids and liver in 4 weeks.

## 2014-11-13 NOTE — Assessment & Plan Note (Signed)
Patient's blood pressure is mildly elevated. His ARB was changed recently to lisinopril which he is to start today. I have asked him to follow his blood pressure and we will increase as needed.

## 2014-11-13 NOTE — Assessment & Plan Note (Signed)
Continue aspirin. His insurance will not cover brilinta. He would like to try Plavix again. If he develops a rash we will need to change back. He has not tolerated Lipitor or Crestor. I will try Pravachol.

## 2014-11-13 NOTE — Assessment & Plan Note (Signed)
LV function improved on most recent echocardiogram. Continue beta blocker and ACE inhibitor.

## 2014-12-11 ENCOUNTER — Telehealth: Payer: Self-pay | Admitting: Cardiology

## 2014-12-11 NOTE — Telephone Encounter (Signed)
Spoke with patient who states he has been on lisinopril for about 1 month and has had SE from this medication. He states he feels like there is something constantly in his throat which caused him to have the urge to cough to clear his throat. He does not have [place] breathing issues. He states he had issues with another BP medication in the past - cannot recall name. He does not have a BP cuff that he believes is working right - states is old.   He would like to know if his medications can be changed since he is having SE

## 2014-12-11 NOTE — Telephone Encounter (Signed)
DC lisinopril; cozaar 50 mg daily Brian Crenshaw  

## 2014-12-11 NOTE — Telephone Encounter (Signed)
Pt been taking Lisinopril,he can not tolerate it. It made him feel like he is choking.He would like to try something else,whatever the doctor thinks is best.

## 2014-12-12 MED ORDER — LOSARTAN POTASSIUM 50 MG PO TABS: 50.0000 mg | ORAL_TABLET | Freq: Every day | ORAL | Status: AC

## 2014-12-12 NOTE — Telephone Encounter (Signed)
Called pt back w/ instructions on new medication. He voiced understanding & agreement.

## 2014-12-13 ENCOUNTER — Other Ambulatory Visit: Payer: Self-pay | Admitting: Cardiology

## 2014-12-14 ENCOUNTER — Telehealth: Payer: Self-pay | Admitting: Cardiology

## 2014-12-14 LAB — HEPATIC FUNCTION PANEL
ALT: 20 U/L (ref 0–53)
AST: 16 U/L (ref 0–37)
Albumin: 4.3 g/dL (ref 3.5–5.2)
Alkaline Phosphatase: 59 U/L (ref 39–117)
BILIRUBIN TOTAL: 0.9 mg/dL (ref 0.2–1.2)
Bilirubin, Direct: 0.2 mg/dL (ref 0.0–0.3)
Indirect Bilirubin: 0.7 mg/dL (ref 0.2–1.2)
TOTAL PROTEIN: 6.9 g/dL (ref 6.0–8.3)

## 2014-12-14 LAB — LIPID PANEL
CHOL/HDL RATIO: 6.2 ratio
Cholesterol: 206 mg/dL — ABNORMAL HIGH (ref 0–200)
HDL: 33 mg/dL — AB (ref >=40)
LDL CALC: 132 mg/dL — AB (ref 0–99)
Triglycerides: 204 mg/dL — ABNORMAL HIGH (ref <150)
VLDL: 41 mg/dL — AB (ref 0–40)

## 2014-12-14 NOTE — Telephone Encounter (Signed)
Pt is returning Nathan's call in reference to some blood test results. Please call back  Thanks

## 2014-12-14 NOTE — Telephone Encounter (Signed)
Duplicate encounter. See result notes for recent labwork.

## 2015-01-12 ENCOUNTER — Other Ambulatory Visit: Payer: Self-pay | Admitting: Cardiology

## 2015-04-18 ENCOUNTER — Other Ambulatory Visit: Payer: Self-pay | Admitting: *Deleted

## 2015-04-18 MED ORDER — GLIPIZIDE ER 5 MG PO TB24: 5.0000 mg | ORAL_TABLET | Freq: Every day | ORAL | Status: DC

## 2015-04-18 MED ORDER — ISOSORBIDE MONONITRATE ER 30 MG PO TB24: 30.0000 mg | ORAL_TABLET | Freq: Every day | ORAL | Status: DC

## 2015-05-17 ENCOUNTER — Other Ambulatory Visit: Payer: Self-pay | Admitting: Cardiology

## 2015-05-17 NOTE — Telephone Encounter (Signed)
REFILL 

## 2015-05-29 ENCOUNTER — Encounter (HOSPITAL_COMMUNITY): Payer: Self-pay

## 2015-05-29 ENCOUNTER — Emergency Department (HOSPITAL_COMMUNITY)
Admission: EM | Admit: 2015-05-29 | Discharge: 2015-06-07 | Disposition: E | Payer: Medicare Other | Attending: Emergency Medicine | Admitting: Emergency Medicine

## 2015-05-29 DIAGNOSIS — Z8679 Personal history of other diseases of the circulatory system: Secondary | ICD-10-CM

## 2015-05-29 DIAGNOSIS — Z7902 Long term (current) use of antithrombotics/antiplatelets: Secondary | ICD-10-CM | POA: Diagnosis not present

## 2015-05-29 DIAGNOSIS — Z8572 Personal history of non-Hodgkin lymphomas: Secondary | ICD-10-CM | POA: Diagnosis not present

## 2015-05-29 DIAGNOSIS — Z79899 Other long term (current) drug therapy: Secondary | ICD-10-CM | POA: Diagnosis not present

## 2015-05-29 DIAGNOSIS — I10 Essential (primary) hypertension: Secondary | ICD-10-CM | POA: Insufficient documentation

## 2015-05-29 DIAGNOSIS — E119 Type 2 diabetes mellitus without complications: Secondary | ICD-10-CM | POA: Insufficient documentation

## 2015-05-29 DIAGNOSIS — I2510 Atherosclerotic heart disease of native coronary artery without angina pectoris: Secondary | ICD-10-CM | POA: Diagnosis not present

## 2015-05-29 DIAGNOSIS — Z87891 Personal history of nicotine dependence: Secondary | ICD-10-CM | POA: Insufficient documentation

## 2015-05-29 DIAGNOSIS — Z8739 Personal history of other diseases of the musculoskeletal system and connective tissue: Secondary | ICD-10-CM | POA: Diagnosis not present

## 2015-05-29 DIAGNOSIS — Z8669 Personal history of other diseases of the nervous system and sense organs: Secondary | ICD-10-CM | POA: Diagnosis not present

## 2015-05-29 DIAGNOSIS — I469 Cardiac arrest, cause unspecified: Secondary | ICD-10-CM | POA: Diagnosis present

## 2015-05-29 DIAGNOSIS — Z7982 Long term (current) use of aspirin: Secondary | ICD-10-CM | POA: Diagnosis not present

## 2015-05-29 DIAGNOSIS — R079 Chest pain, unspecified: Secondary | ICD-10-CM | POA: Diagnosis not present

## 2015-05-29 DIAGNOSIS — Z8659 Personal history of other mental and behavioral disorders: Secondary | ICD-10-CM | POA: Insufficient documentation

## 2015-05-29 DIAGNOSIS — Z951 Presence of aortocoronary bypass graft: Secondary | ICD-10-CM | POA: Insufficient documentation

## 2015-05-29 DIAGNOSIS — E785 Hyperlipidemia, unspecified: Secondary | ICD-10-CM | POA: Insufficient documentation

## 2015-05-29 DIAGNOSIS — Z86711 Personal history of pulmonary embolism: Secondary | ICD-10-CM | POA: Diagnosis not present

## 2015-05-29 DIAGNOSIS — Z87828 Personal history of other (healed) physical injury and trauma: Secondary | ICD-10-CM | POA: Insufficient documentation

## 2015-05-29 DIAGNOSIS — Z86718 Personal history of other venous thrombosis and embolism: Secondary | ICD-10-CM | POA: Diagnosis not present

## 2015-05-29 DIAGNOSIS — Z8673 Personal history of transient ischemic attack (TIA), and cerebral infarction without residual deficits: Secondary | ICD-10-CM | POA: Diagnosis not present

## 2015-05-29 DIAGNOSIS — I252 Old myocardial infarction: Secondary | ICD-10-CM | POA: Insufficient documentation

## 2015-05-29 DIAGNOSIS — R231 Pallor: Secondary | ICD-10-CM | POA: Diagnosis not present

## 2015-05-29 LAB — I-STAT TROPONIN, ED: Troponin i, poc: 0.09 ng/mL (ref 0.00–0.08)

## 2015-05-29 LAB — I-STAT CHEM 8, ED
BUN: 4 mg/dL — AB (ref 6–20)
CHLORIDE: 116 mmol/L — AB (ref 101–111)
Calcium, Ion: <0.3 mmol/L — CL (ref 1.12–1.23)
Creatinine, Ser: 0.5 mg/dL — ABNORMAL LOW (ref 0.61–1.24)
Glucose, Bld: 107 mg/dL — ABNORMAL HIGH (ref 65–99)
HCT: 25 % — ABNORMAL LOW (ref 39.0–52.0)
Hemoglobin: 8.5 g/dL — ABNORMAL LOW (ref 13.0–17.0)
Potassium: 2.4 mmol/L — CL (ref 3.5–5.1)
Sodium: 152 mmol/L — ABNORMAL HIGH (ref 135–145)
TCO2: 9 mmol/L (ref 0–100)

## 2015-05-29 LAB — I-STAT CG4 LACTIC ACID, ED: LACTIC ACID, VENOUS: 7.29 mmol/L — AB (ref 0.5–2.0)

## 2015-05-29 MED ORDER — NOREPINEPHRINE BITARTRATE 1 MG/ML IV SOLN
0.0000 ug/min | INTRAVENOUS | Status: DC
  Administered 2015-05-29: 20 ug/min via INTRAVENOUS
  Filled 2015-05-29: qty 4

## 2015-05-29 MED ORDER — SODIUM BICARBONATE 8.4 % IV SOLN
INTRAVENOUS | Status: AC | PRN
  Administered 2015-05-29: 50 meq via INTRAVENOUS

## 2015-05-29 MED ORDER — EPINEPHRINE HCL 0.1 MG/ML IJ SOSY
PREFILLED_SYRINGE | INTRAMUSCULAR | Status: AC | PRN
  Administered 2015-05-29 (×8): 1 mg via INTRAVENOUS

## 2015-05-29 MED ORDER — SODIUM CHLORIDE 0.9 % IV SOLN
INTRAVENOUS | Status: AC | PRN
  Administered 2015-05-29: 1000 mL via INTRAVENOUS

## 2015-05-31 LAB — I-STAT CG4 LACTIC ACID, ED: Lactic Acid, Venous: 7.09 mmol/L (ref 0.5–2.0)

## 2015-06-07 NOTE — ED Notes (Addendum)
Pt here with [facility]- called out for chest pain. Pt had witnessed seizure by EMS followed by cardiac arrest. 2 shocks with EMS. 300 amiodarone. 1L cold saline.

## 2015-06-07 NOTE — ED Notes (Signed)
Notified house coverage that patient is being sent to morgue per woody, charge, death cert to be signed. Spoke to [employer].

## 2015-06-07 NOTE — ED Notes (Signed)
Spoke to Dr. Reather Converse, not an ME or autopsy case due previous medical history, awaiting contact from primary MD in [place] to sign death certificate.

## 2015-06-07 NOTE — Code Documentation (Signed)
[facility]- pt initially called out for chest pain, witnessed seizure by EMS followed by EMS. Pt received 7epi, 2 shocks, 300mg  amiodarone and 1L cold saline. PEA on the monitor. Pulses regained but lost en route.

## 2015-06-07 NOTE — Progress Notes (Signed)
Patient arrived in ERE with compressions being performed.  Confirmed King tube placement with good bilateral BS.  Continued to support wsith ventilations during code.
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
## 2015-06-07 NOTE — ED Notes (Signed)
Called bed control to inform plan of care, to follow up with Dr. Reather Converse.

## 2015-06-07 NOTE — ED Notes (Signed)
Unable to reach patient's primary MD: Dr. Terald Sleeper, [address] [place], [place]  [address]  [phone].  Please call in a.m.

## 2015-06-07 NOTE — ED Notes (Signed)
Spoke to Dr. Reather Converse and Dr. Lavena Bullion, death certificate to be signed.

## 2015-06-07 NOTE — Progress Notes (Signed)
   Jun 07, 2015 1900  Clinical Encounter Type  Visited With Family  Visit Type ED;Code;Death;Initial;Spiritual support  Referral From Nurse  Consult/Referral To Chaplain  Spiritual Encounters  Spiritual Needs Prayer;Emotional;Grief support  Stress Factors  Family Stress Factors Loss;Family relationships;Financial concerns  [place] paged to ED for post CPR; pt death; [place] present with MD for notification; prayer, grief and emotional support; [place] worked with RN to have pt moved to quiet location for family to have solemn time.

## 2015-06-07 NOTE — Code Documentation (Signed)
Patient time of death occurred at 61.

## 2015-06-07 DEATH — deceased

## 2015-07-23 ENCOUNTER — Other Ambulatory Visit: Payer: BC Managed Care – PPO

## 2015-07-23 ENCOUNTER — Ambulatory Visit: Payer: BC Managed Care – PPO | Admitting: Hematology and Oncology
